# Patient Record
Sex: Male | Born: 1945 | Race: White | Hispanic: No | Marital: Single | Smoking: Former smoker
Health system: Southern US, Community
[De-identification: ages and names within clinical notes are randomized; demographics above are authoritative.]

## PROBLEM LIST (undated history)

## (undated) DIAGNOSIS — E119 Type 2 diabetes mellitus without complications: Secondary | ICD-10-CM

## (undated) DIAGNOSIS — K429 Umbilical hernia without obstruction or gangrene: Secondary | ICD-10-CM

## (undated) DIAGNOSIS — E1110 Type 2 diabetes mellitus with ketoacidosis without coma: Secondary | ICD-10-CM

## (undated) DIAGNOSIS — I4891 Unspecified atrial fibrillation: Secondary | ICD-10-CM

## (undated) DIAGNOSIS — N183 Chronic kidney disease, stage 3 unspecified: Secondary | ICD-10-CM

## (undated) DIAGNOSIS — E7800 Pure hypercholesterolemia, unspecified: Secondary | ICD-10-CM

## (undated) DIAGNOSIS — I10 Essential (primary) hypertension: Secondary | ICD-10-CM

## (undated) HISTORY — PX: PACEMAKER IMPLANT: EP1218

---

## 2017-06-07 DIAGNOSIS — Z86010 Personal history of colonic polyps: Secondary | ICD-10-CM

## 2017-06-07 HISTORY — PX: COLONOSCOPY: SHX174

## 2017-06-07 HISTORY — DX: Personal history of colonic polyps: Z86.010

## 2017-06-07 HISTORY — PX: ESOPHAGOGASTRODUODENOSCOPY: SHX1529

## 2017-12-24 ENCOUNTER — Other Ambulatory Visit: Payer: Self-pay | Admitting: Chiropractic Medicine

## 2017-12-24 DIAGNOSIS — M5416 Radiculopathy, lumbar region: Secondary | ICD-10-CM

## 2017-12-28 ENCOUNTER — Ambulatory Visit
Admission: RE | Admit: 2017-12-28 | Discharge: 2017-12-28 | Disposition: A | Discharge status: Home / Self Care | Payer: Medicare HMO | Source: Ambulatory Visit | Attending: Chiropractic Medicine | Admitting: Chiropractic Medicine

## 2017-12-28 DIAGNOSIS — M5416 Radiculopathy, lumbar region: Secondary | ICD-10-CM

## 2020-05-08 ENCOUNTER — Emergency Department (HOSPITAL_BASED_OUTPATIENT_CLINIC_OR_DEPARTMENT_OTHER): Payer: Medicare HMO

## 2020-05-08 ENCOUNTER — Inpatient Hospital Stay (HOSPITAL_BASED_OUTPATIENT_CLINIC_OR_DEPARTMENT_OTHER)
Admission: EM | Admit: 2020-05-08 | Discharge: 2020-05-14 | DRG: 417 | Disposition: A | Discharge status: Home / Self Care | Payer: Medicare HMO | Attending: Internal Medicine | Admitting: Internal Medicine

## 2020-05-08 ENCOUNTER — Encounter (HOSPITAL_BASED_OUTPATIENT_CLINIC_OR_DEPARTMENT_OTHER): Payer: Self-pay

## 2020-05-08 DIAGNOSIS — M47816 Spondylosis without myelopathy or radiculopathy, lumbar region: Secondary | ICD-10-CM | POA: Diagnosis present

## 2020-05-08 DIAGNOSIS — K429 Umbilical hernia without obstruction or gangrene: Secondary | ICD-10-CM | POA: Diagnosis present

## 2020-05-08 DIAGNOSIS — K5900 Constipation, unspecified: Secondary | ICD-10-CM | POA: Diagnosis present

## 2020-05-08 DIAGNOSIS — E11319 Type 2 diabetes mellitus with unspecified diabetic retinopathy without macular edema: Secondary | ICD-10-CM | POA: Diagnosis present

## 2020-05-08 DIAGNOSIS — N183 Chronic kidney disease, stage 3 unspecified: Secondary | ICD-10-CM | POA: Diagnosis present

## 2020-05-08 DIAGNOSIS — E1122 Type 2 diabetes mellitus with diabetic chronic kidney disease: Secondary | ICD-10-CM | POA: Diagnosis present

## 2020-05-08 DIAGNOSIS — L8990 Pressure ulcer of unspecified site, unspecified stage: Secondary | ICD-10-CM | POA: Insufficient documentation

## 2020-05-08 DIAGNOSIS — K8070 Calculus of gallbladder and bile duct without cholecystitis without obstruction: Principal | ICD-10-CM | POA: Diagnosis present

## 2020-05-08 DIAGNOSIS — L89151 Pressure ulcer of sacral region, stage 1: Secondary | ICD-10-CM | POA: Diagnosis present

## 2020-05-08 DIAGNOSIS — E7800 Pure hypercholesterolemia, unspecified: Secondary | ICD-10-CM | POA: Diagnosis present

## 2020-05-08 DIAGNOSIS — I4891 Unspecified atrial fibrillation: Secondary | ICD-10-CM | POA: Diagnosis present

## 2020-05-08 DIAGNOSIS — E876 Hypokalemia: Secondary | ICD-10-CM | POA: Diagnosis present

## 2020-05-08 DIAGNOSIS — I4821 Permanent atrial fibrillation: Secondary | ICD-10-CM | POA: Diagnosis present

## 2020-05-08 DIAGNOSIS — Z7984 Long term (current) use of oral hypoglycemic drugs: Secondary | ICD-10-CM

## 2020-05-08 DIAGNOSIS — I129 Hypertensive chronic kidney disease with stage 1 through stage 4 chronic kidney disease, or unspecified chronic kidney disease: Secondary | ICD-10-CM | POA: Diagnosis present

## 2020-05-08 DIAGNOSIS — Z7901 Long term (current) use of anticoagulants: Secondary | ICD-10-CM

## 2020-05-08 DIAGNOSIS — K8050 Calculus of bile duct without cholangitis or cholecystitis without obstruction: Secondary | ICD-10-CM | POA: Diagnosis present

## 2020-05-08 DIAGNOSIS — Z950 Presence of cardiac pacemaker: Secondary | ICD-10-CM

## 2020-05-08 DIAGNOSIS — N1832 Chronic kidney disease, stage 3b: Secondary | ICD-10-CM | POA: Diagnosis present

## 2020-05-08 DIAGNOSIS — E1110 Type 2 diabetes mellitus with ketoacidosis without coma: Secondary | ICD-10-CM | POA: Diagnosis present

## 2020-05-08 DIAGNOSIS — R7401 Elevation of levels of liver transaminase levels: Secondary | ICD-10-CM

## 2020-05-08 DIAGNOSIS — E785 Hyperlipidemia, unspecified: Secondary | ICD-10-CM | POA: Diagnosis present

## 2020-05-08 DIAGNOSIS — Z87442 Personal history of urinary calculi: Secondary | ICD-10-CM

## 2020-05-08 DIAGNOSIS — K420 Umbilical hernia with obstruction, without gangrene: Secondary | ICD-10-CM | POA: Diagnosis present

## 2020-05-08 DIAGNOSIS — Z87891 Personal history of nicotine dependence: Secondary | ICD-10-CM

## 2020-05-08 DIAGNOSIS — M419 Scoliosis, unspecified: Secondary | ICD-10-CM | POA: Diagnosis present

## 2020-05-08 DIAGNOSIS — Z79899 Other long term (current) drug therapy: Secondary | ICD-10-CM

## 2020-05-08 DIAGNOSIS — Z20822 Contact with and (suspected) exposure to covid-19: Secondary | ICD-10-CM | POA: Diagnosis present

## 2020-05-08 DIAGNOSIS — I10 Essential (primary) hypertension: Secondary | ICD-10-CM | POA: Diagnosis present

## 2020-05-08 HISTORY — DX: Type 2 diabetes mellitus with ketoacidosis without coma: E11.10

## 2020-05-08 HISTORY — DX: Essential (primary) hypertension: I10

## 2020-05-08 HISTORY — DX: Umbilical hernia without obstruction or gangrene: K42.9

## 2020-05-08 HISTORY — DX: Pure hypercholesterolemia, unspecified: E78.00

## 2020-05-08 HISTORY — DX: Unspecified atrial fibrillation: I48.91

## 2020-05-08 HISTORY — DX: Chronic kidney disease, stage 3 unspecified: N18.30

## 2020-05-08 HISTORY — DX: Type 2 diabetes mellitus without complications: E11.9

## 2020-05-08 LAB — COMPREHENSIVE METABOLIC PANEL
ALT: 172 U/L — ABNORMAL HIGH (ref 0–44)
AST: 303 U/L — ABNORMAL HIGH (ref 15–41)
Albumin: 4.2 g/dL (ref 3.5–5.0)
Alkaline Phosphatase: 360 U/L — ABNORMAL HIGH (ref 38–126)
Anion gap: 24 — ABNORMAL HIGH (ref 5–15)
BUN: 38 mg/dL — ABNORMAL HIGH (ref 8–23)
CO2: 22 mmol/L (ref 22–32)
Calcium: 10.6 mg/dL — ABNORMAL HIGH (ref 8.9–10.3)
Chloride: 89 mmol/L — ABNORMAL LOW (ref 98–111)
Creatinine, Ser: 1.57 mg/dL — ABNORMAL HIGH (ref 0.61–1.24)
GFR, Estimated: 46 mL/min — ABNORMAL LOW (ref >60)
Glucose, Bld: 396 mg/dL — ABNORMAL HIGH (ref 70–99)
Potassium: 3.3 mmol/L — ABNORMAL LOW (ref 3.5–5.1)
Sodium: 135 mmol/L (ref 135–145)
Total Bilirubin: 5.9 mg/dL — ABNORMAL HIGH (ref 0.3–1.2)
Total Protein: 7.6 g/dL (ref 6.5–8.1)

## 2020-05-08 LAB — CBC
HCT: 40.9 % (ref 39.0–52.0)
Hemoglobin: 14.2 g/dL (ref 13.0–17.0)
MCH: 30.1 pg (ref 26.0–34.0)
MCHC: 34.7 g/dL (ref 30.0–36.0)
MCV: 86.7 fL (ref 80.0–100.0)
Platelets: 147 10*3/uL — ABNORMAL LOW (ref 150–400)
RBC: 4.72 MIL/uL (ref 4.22–5.81)
RDW: 14.5 % (ref 11.5–15.5)
WBC: 8.3 10*3/uL (ref 4.0–10.5)
nRBC: 0 % (ref 0.0–0.2)

## 2020-05-08 LAB — CBG MONITORING, ED: Glucose-Capillary: 305 mg/dL — ABNORMAL HIGH (ref 70–99)

## 2020-05-08 LAB — LIPASE, BLOOD: Lipase: 39 U/L (ref 11–51)

## 2020-05-08 LAB — TROPONIN I (HIGH SENSITIVITY): Troponin I (High Sensitivity): 11 ng/L (ref <18)

## 2020-05-08 MED ORDER — LACTATED RINGERS IV BOLUS
20.0000 mL/kg | Freq: Once | INTRAVENOUS | Status: AC
  Administered 2020-05-08: 1842 mL via INTRAVENOUS

## 2020-05-08 MED ORDER — INSULIN REGULAR(HUMAN) IN NACL 100-0.9 UT/100ML-% IV SOLN
INTRAVENOUS | Status: DC
  Administered 2020-05-08: 6 [IU]/h via INTRAVENOUS
  Filled 2020-05-08: qty 100

## 2020-05-08 MED ORDER — DEXTROSE 50 % IV SOLN: 0.0000 mL | INTRAVENOUS | Status: DC | PRN

## 2020-05-08 MED ORDER — POTASSIUM CHLORIDE 10 MEQ/100ML IV SOLN
10.0000 meq | INTRAVENOUS | Status: AC
  Administered 2020-05-08 – 2020-05-09 (×3): 10 meq via INTRAVENOUS
  Filled 2020-05-08 (×4): qty 100

## 2020-05-08 MED ORDER — SODIUM CHLORIDE 0.9 % IV BOLUS: 1000.0000 mL | Freq: Once | INTRAVENOUS | Status: DC

## 2020-05-08 MED ORDER — LACTATED RINGERS IV SOLN: INTRAVENOUS | Status: DC

## 2020-05-08 MED ORDER — SODIUM CHLORIDE 0.9 % IV SOLN: INTRAVENOUS | Status: DC | PRN

## 2020-05-08 MED ORDER — DEXTROSE IN LACTATED RINGERS 5 % IV SOLN: INTRAVENOUS | Status: DC

## 2020-05-08 NOTE — ED Triage Notes (Signed)
Pt states he hasnt had a BM in 2 days. C/o chest pain across chest since 11am. Vomiting the past few hours.

## 2020-05-08 NOTE — Progress Notes (Signed)
Pt venous blood gas not crossing over from i-STAT machine into epic.  Results given to MD and note made in chart.  Results are as follows: pH-7.40, PCO2-43.3, PvO2-29, HCO3-27.4

## 2020-05-08 NOTE — ED Provider Notes (Signed)
[facility] EMERGENCY DEPARTMENT Provider Note   CSN: [id number] Arrival date & time: 05/08/20  1733     History Chief Complaint  Patient presents with  . Chest Pain  . Vomiting    Gabriel Duarte is a 75 y.o. male with past medical history of A. fib on Xarelto, CKD, hypertension, hyperlipidemia, diabetes, presenting emergency department with complaint of chest pain that began at 11 AM this morning.  Patient states he was watching TV.  Pain was felt across his chest, and was persisting until arriving to the ED.  He states his began to ease off.  He denies associated diaphoresis, shortness of breath, abdominal pain.  He did have some upset stomach and vomited this afternoon and thinks that is improving his symptoms.  Also states that he has been having constipation for last few days.  His stools are small and hard.  He states he drank a few tablespoons of Epson salt today with a diet drink thinking that would help his bowels move.  He did take Metamucil yesterday as well for symptoms without relief. Denies abdominal pain, history of small bowel obstruction or abdominal surgeries.  Per chart review, gets most of his medical care by [facility]. Takes Xarelto, and has not missed any doses  The history is provided by the patient and medical records.       Past Medical History:  Diagnosis Date  . Atrial fibrillation (HCC)   . CKD (chronic kidney disease) stage 3, GFR 30-59 ml/min (HCC)   . Diabetes mellitus without complication (HCC)   . High cholesterol   . Hypertension     Patient Active Problem List   Diagnosis Date Noted  . Choledocholithiasis 05/09/2020    Past Surgical History:  Procedure Laterality Date  . PACEMAKER IMPLANT         History reviewed. No pertinent family history.  Social History   Tobacco Use  . Smoking status: Former Smoker  Substance Use Topics  . Alcohol use: Never  . Drug use: Never    Home Medications Prior to Admission  medications   Medication Sig Start Date End Date Taking? Authorizing Provider  amLODipine (NORVASC) 10 MG tablet  04/09/20  Yes [provider]  Cholecalciferol 125 MCG (5000 UT) TABS Take by mouth. 02/19/17  Yes [provider]  fluticasone (FLONASE) 50 MCG/ACT nasal spray 2 sprays by Each Nare route daily as needed. 07/21/15  Yes [provider]  glipiZIDE (GLUCOTROL) 10 MG tablet Take by mouth. 06/12/10  Yes [provider]  linagliptin (TRADJENTA) 5 MG TABS tablet Take 1 tablet by mouth daily. 09/08/16  Yes [provider]  propranolol (INDERAL) 20 MG tablet Take 20 mg by mouth daily.   Yes [provider]  empagliflozin (JARDIANCE) 10 MG TABS tablet Take by mouth.    [provider]  hydrochlorothiazide (HYDRODIURIL) 25 MG tablet Take by mouth.    [provider]  Omega-3 Fatty Acids (FISH OIL) 1000 MG CAPS Take by mouth.    [provider]  STUDY - ASPIRE - apixaban 5 mg or placebo tablet (PI-Sethi) Eliquis 5 mg tablet    [provider]    Allergies    Patient has no known allergies.  Review of Systems   Review of Systems  Constitutional: Negative for chills, diaphoresis and fever.  Respiratory: Negative for cough and shortness of breath.   Cardiovascular: Positive for chest pain. Negative for leg swelling.  Gastrointestinal: Positive for constipation and  vomiting.  All other systems reviewed and are negative.   Physical Exam Updated Vital Signs BP (!) 146/86 (BP Location: Right Arm)   Pulse 93   Temp 97.6 F (36.4 C) (Oral)   Resp 20   Ht 5\' 7"  (1.702 m)   Wt 92.1 kg   SpO2 95%   BMI 31.79 kg/m   Physical Exam Vitals and nursing note reviewed.  Constitutional:      General: He is not in acute distress.    Appearance: He is well-developed and well-nourished.  HENT:     Head: Normocephalic and atraumatic.  Eyes:     General: Scleral icterus present.  Cardiovascular:     Rate and  Rhythm: Normal rate. Rhythm irregular.     Comments: Trace pretibial edema bilaterally Pulmonary:     Effort: Pulmonary effort is normal. No respiratory distress.     Comments: Diminished bilaterally Abdominal:     General: Bowel sounds are normal.     Palpations: Abdomen is soft.     Tenderness: There is no abdominal tenderness. There is no guarding or rebound.     Hernia: A hernia (Large umbilical hernia (chronic) nontender.) is present.  Skin:    General: Skin is warm.  Neurological:     Mental Status: He is alert.  Psychiatric:        Mood and Affect: Mood and affect normal.        Behavior: Behavior normal.     ED Results / Procedures / Treatments   Labs (all labs ordered are listed, but only abnormal results are displayed) Labs Reviewed  CBC - Abnormal; Notable for the following components:      Result Value   Platelets 147 (*)    All other components within normal limits  COMPREHENSIVE METABOLIC PANEL - Abnormal; Notable for the following components:   Potassium 3.3 (*)    Chloride 89 (*)    Glucose, Bld 396 (*)    BUN 38 (*)    Creatinine, Ser 1.57 (*)    Calcium 10.6 (*)    AST 303 (*)    ALT 172 (*)    Alkaline Phosphatase 360 (*)    Total Bilirubin 5.9 (*)    GFR, Estimated 46 (*)    Anion gap 24 (*)    All other components within normal limits  URINALYSIS, ROUTINE W REFLEX MICROSCOPIC - Abnormal; Notable for the following components:   Glucose, UA >=500 (*)    Hgb urine dipstick SMALL (*)    Ketones, ur 80 (*)    All other components within normal limits  URINALYSIS, MICROSCOPIC (REFLEX) - Abnormal; Notable for the following components:   Bacteria, UA RARE (*)    All other components within normal limits  CBG MONITORING, ED - Abnormal; Notable for the following components:   Glucose-Capillary 305 (*)    All other components within normal limits  CBG MONITORING, ED - Abnormal; Notable for the following components:   Glucose-Capillary 264 (*)    All  other components within normal limits  RESP PANEL BY RT-PCR (FLU A&B, COVID) ARPGX2  LIPASE, BLOOD  PROTIME-INR  BLOOD GAS, VENOUS  BETA-HYDROXYBUTYRIC ACID  HEPATITIS PANEL, ACUTE  TROPONIN I (HIGH SENSITIVITY)  TROPONIN I (HIGH SENSITIVITY)   VBG: pH-7.40, PCO2-43.3, PvO2-29, HCO3-27.4   EKG None  Radiology CT Abdomen Pelvis Wo Contrast  Result Date: 05/08/2020 CLINICAL DATA:  Acute nonlocalized abdominal pain, constipation, transaminitis EXAM: CT ABDOMEN AND PELVIS WITHOUT CONTRAST TECHNIQUE: Multidetector CT imaging of  the abdomen and pelvis was performed following the standard protocol without IV contrast. COMPARISON:  None. FINDINGS: Lower chest: The visualized lung bases are clear. Mediastinal shift to the left. Extensive multi-vessel coronary artery calcification. Mild global cardiomegaly. Metallic foreign body is seen within the right ventricular outflow tract. Mild pericardial calcification Hepatobiliary: Layering hyperdensity within the gallbladder likely represents sludge or layering gallstones. No pericholecystic inflammatory changes identified, however. The liver is unremarkable. No intra or extrahepatic biliary ductal dilation. 8 mm calcified calculus or debris is seen within the distal common duct in keeping with choledocholithiasis. Pancreas: Unremarkable Spleen: Unremarkable Adrenals/Urinary Tract: The adrenal glands are unremarkable. The kidneys are normal in size and position. 2.4 cm exophytic simple cortical cyst arises from the lower pole of the left kidney. Immediately adjacent to this a homogeneously hyperdense lesion measuring 2.5 cm is seen most in keeping with a hyperdense renal cyst containing probable milk of calcium. The kidneys are otherwise unremarkable. Bladder unremarkable. Stomach/Bowel: Mild sigmoid diverticulosis. The stomach, small bowel, and large bowel are otherwise unremarkable. Appendix absent. No free intraperitoneal gas or fluid. Vascular/Lymphatic:  Extensive aortoiliac atherosclerotic calcification. No aortic aneurysm. No pathologic adenopathy within the abdomen and pelvis. Reproductive: Prostate is unremarkable. Other: Moderate fat containing umbilical hernia. Mild soft tissue infiltration involving the subcutaneous fat surrounding the hernia sac may relate to early changes of incarceration. Musculoskeletal: Degenerative changes are seen within the lumbar spine. No lytic or blastic bone lesion. IMPRESSION: Choledocholithiasis with 8 mm calculus or calcific debris within the distal duct at the level of the ampulla. Cholelithiasis. Moderate fat containing umbilical hernia with surrounding inflammatory stranding within the subcutaneous fat of the anterior abdominal wall possibly representing early changes of incarceration. Aortic Atherosclerosis (ICD10-I70.0). Electronically Signed   By: Helyn Numbers MD   On: 05/08/2020 23:35   DG Chest 2 View  Result Date: 05/08/2020 CLINICAL DATA:  Chest pain EXAM: CHEST - 2 VIEW COMPARISON:  Chest x-ray dated 06/30/2017. FINDINGS: Stable cardiomegaly. Lungs are clear. No pleural effusion or pneumothorax is seen. Osseous structures about the chest are unremarkable. IMPRESSION: No active cardiopulmonary disease. No evidence of pneumonia or pulmonary edema. Stable cardiomegaly. Electronically Signed   By: Bary Richard M.D.   On: 05/08/2020 18:26   US Abdomen Limited RUQ (LIVER/GB)  Result Date: 05/08/2020 CLINICAL DATA:  Transaminitis EXAM: ULTRASOUND ABDOMEN LIMITED RIGHT UPPER QUADRANT COMPARISON:  None. FINDINGS: Gallbladder: Gallbladder is moderately distended, with dependent gallbladder sludge identified. No shadowing gallstones. Borderline gallbladder wall thickening measuring 3 mm. No pericholecystic fluid. Negative sonographic Murphy sign. Common bile duct: Diameter: 7 mm Liver: Diffuse increased liver echotexture most consistent with hepatic steatosis. No focal liver abnormality. Portal vein is patent on color  Doppler imaging with normal direction of blood flow towards the liver. Other: Incidental 1.8 cm simple right renal cyst. IMPRESSION: 1. Gallbladder sludge, with nonspecific borderline gallbladder wall thickening. No evidence of cholelithiasis or cholecystitis. 2. Hepatic steatosis. Electronically Signed   By: Sharlet Salina M.D.   On: 05/08/2020 21:49    Procedures Procedures   Medications Ordered in ED Medications  insulin regular, human (MYXREDLIN) 100 units/ 100 mL infusion (4.2 Units/hr Intravenous Rate/Dose Change 05/09/20 0032)  lactated ringers infusion (has no administration in time range)  dextrose 5 % in lactated ringers infusion (has no administration in time range)  dextrose 50 % solution 0-50 mL (has no administration in time range)  potassium chloride 10 mEq in 100 mL IVPB (10 mEq Intravenous New Bag/Given 05/08/20 2342)  0.9 %  sodium chloride infusion ( Intravenous New Bag/Given 05/08/20 2340)  piperacillin-tazobactam (ZOSYN) IVPB 3.375 g (has no administration in time range)  piperacillin-tazobactam (ZOSYN) IVPB 3.375 g (has no administration in time range)  lactated ringers bolus 1,842 mL (1,842 mLs Intravenous New Bag/Given 05/08/20 2257)    ED Course  I have reviewed the triage vital signs and the nursing notes.  Pertinent labs & imaging results that were available during my care of the patient were reviewed by me and considered in my medical decision making (see chart for details).  Clinical Course as of 05/09/20 0035  Wed May 08, 2020  2231 Glucose(!): 396 [JR]  2231 AST(!): 303 [JR]  2231 ALT(!): 172 [JR]  2231 Alkaline Phosphatase(!): 360 [JR]  2232 Total Bilirubin(!): 5.9 [JR]  2232 Anion gap(!): 24 [JR]  Thu May 09, 2020  0000 Discussed with general surgery Dr. Corliss Skains.  Recommend CT findings do not require any emergent surgical intervention at this time.  Agrees with medical admission and gastroenterology management. [JR]    Clinical Course User Index [JR]  Mirjana Tarleton, [place] N, PA-C   MDM Rules/Calculators/A&P                          Patient is 75 year old gentleman presenting with complaint of chest pain across his chest that began 11 AM while watching TV.  He has some discomfort throughout the day though improved after vomiting x1.  He also has had constipation for last few days.  On exam he is not having any symptoms of pain.  His abdomen is benign.  He is noted to have an umbilical hernia which he states is chronic and not causing him any issues.  It is not tender on exam and appears to reduce.  He is in no acute distress.  His initial vital signs are within normal limits, afebrile.  Will order cardiac markers, as well as CBC, CMP, lipase, EKG and chest x-ray.  Troponins are unremarkable.  Chest x-ray is clear.  Patient has large elevated gap at 24 with hyperglycemia at 400.  VBG though shows pH 7.4 within normal limits.  No other obvious source of significant elevation in gap.  He has only vomited once today.  [ VBG results: pH-7.40, PCO2-43.3, PvO2-29, HCO3-27.4]  Regarding transaminitis, he is not having any abdominal pain or tenderness on exam.  He has felt better after having the one episode of emesis earlier today.  He is however constipated.  His ultrasound shows gallbladder sludge with possible gallbladder wall thickening though no pericholecystic fluid or stones.  He is afebrile.  CT scan ordered for additional information and is findings consistent with choledocholithiasis with 8 mm calculus in the distal duct of the ampulla.  Also with fat-containing umbilical hernia with some surrounding stranding concerning for incarceration.  However he is not having much pain in this area.  Findings discussed with general surgeon, agrees nonoperative management by general surgery.  GI, Dr. Lavon Paganini, is sent to secure chat message for consultation in the morning.  IV zosyn ordered.   Had long conversation with patient, as initially he wanted to go  home.  Discussed significance of abnormal findings today on work-up and necessity for admission with risks for discharge to home.  Patient reluctant though agreeable to admission for further management.    Final Clinical Impression(s) / ED Diagnoses Final diagnoses:  Transaminitis  Choledocholithiasis  Umbilical hernia without obstruction and without gangrene  Constipation, unspecified constipation type  Rx / DC Orders ED Discharge Orders    None       Liba Hulsey, [place] N, PA-C 05/09/20 Juanetta Snow, MD 05/16/20 1021

## 2020-05-09 ENCOUNTER — Encounter (HOSPITAL_COMMUNITY): Payer: Self-pay | Admitting: Internal Medicine

## 2020-05-09 DIAGNOSIS — L89151 Pressure ulcer of sacral region, stage 1: Secondary | ICD-10-CM | POA: Diagnosis present

## 2020-05-09 DIAGNOSIS — Z20822 Contact with and (suspected) exposure to covid-19: Secondary | ICD-10-CM | POA: Diagnosis present

## 2020-05-09 DIAGNOSIS — Z01810 Encounter for preprocedural cardiovascular examination: Secondary | ICD-10-CM | POA: Diagnosis not present

## 2020-05-09 DIAGNOSIS — N1832 Chronic kidney disease, stage 3b: Secondary | ICD-10-CM | POA: Diagnosis present

## 2020-05-09 DIAGNOSIS — L8990 Pressure ulcer of unspecified site, unspecified stage: Secondary | ICD-10-CM | POA: Insufficient documentation

## 2020-05-09 DIAGNOSIS — N183 Chronic kidney disease, stage 3 unspecified: Secondary | ICD-10-CM | POA: Diagnosis present

## 2020-05-09 DIAGNOSIS — I4811 Longstanding persistent atrial fibrillation: Secondary | ICD-10-CM | POA: Diagnosis not present

## 2020-05-09 DIAGNOSIS — Z950 Presence of cardiac pacemaker: Secondary | ICD-10-CM | POA: Diagnosis not present

## 2020-05-09 DIAGNOSIS — I10 Essential (primary) hypertension: Secondary | ICD-10-CM | POA: Diagnosis not present

## 2020-05-09 DIAGNOSIS — Z7984 Long term (current) use of oral hypoglycemic drugs: Secondary | ICD-10-CM | POA: Diagnosis not present

## 2020-05-09 DIAGNOSIS — Z79899 Other long term (current) drug therapy: Secondary | ICD-10-CM | POA: Diagnosis not present

## 2020-05-09 DIAGNOSIS — K8050 Calculus of bile duct without cholangitis or cholecystitis without obstruction: Secondary | ICD-10-CM | POA: Diagnosis present

## 2020-05-09 DIAGNOSIS — Z7901 Long term (current) use of anticoagulants: Secondary | ICD-10-CM | POA: Diagnosis not present

## 2020-05-09 DIAGNOSIS — K8070 Calculus of gallbladder and bile duct without cholecystitis without obstruction: Secondary | ICD-10-CM | POA: Diagnosis present

## 2020-05-09 DIAGNOSIS — E876 Hypokalemia: Secondary | ICD-10-CM | POA: Diagnosis present

## 2020-05-09 DIAGNOSIS — M419 Scoliosis, unspecified: Secondary | ICD-10-CM | POA: Diagnosis present

## 2020-05-09 DIAGNOSIS — R945 Abnormal results of liver function studies: Secondary | ICD-10-CM | POA: Diagnosis not present

## 2020-05-09 DIAGNOSIS — K5900 Constipation, unspecified: Secondary | ICD-10-CM | POA: Diagnosis present

## 2020-05-09 DIAGNOSIS — E785 Hyperlipidemia, unspecified: Secondary | ICD-10-CM | POA: Diagnosis present

## 2020-05-09 DIAGNOSIS — E11319 Type 2 diabetes mellitus with unspecified diabetic retinopathy without macular edema: Secondary | ICD-10-CM | POA: Diagnosis present

## 2020-05-09 DIAGNOSIS — E1110 Type 2 diabetes mellitus with ketoacidosis without coma: Secondary | ICD-10-CM | POA: Diagnosis present

## 2020-05-09 DIAGNOSIS — E1122 Type 2 diabetes mellitus with diabetic chronic kidney disease: Secondary | ICD-10-CM | POA: Diagnosis present

## 2020-05-09 DIAGNOSIS — I129 Hypertensive chronic kidney disease with stage 1 through stage 4 chronic kidney disease, or unspecified chronic kidney disease: Secondary | ICD-10-CM | POA: Diagnosis present

## 2020-05-09 DIAGNOSIS — K420 Umbilical hernia with obstruction, without gangrene: Secondary | ICD-10-CM | POA: Diagnosis present

## 2020-05-09 DIAGNOSIS — M47816 Spondylosis without myelopathy or radiculopathy, lumbar region: Secondary | ICD-10-CM | POA: Diagnosis present

## 2020-05-09 DIAGNOSIS — Z87898 Personal history of other specified conditions: Secondary | ICD-10-CM | POA: Diagnosis not present

## 2020-05-09 DIAGNOSIS — I4821 Permanent atrial fibrillation: Secondary | ICD-10-CM | POA: Diagnosis present

## 2020-05-09 DIAGNOSIS — R7401 Elevation of levels of liver transaminase levels: Secondary | ICD-10-CM

## 2020-05-09 DIAGNOSIS — Z87442 Personal history of urinary calculi: Secondary | ICD-10-CM | POA: Diagnosis not present

## 2020-05-09 DIAGNOSIS — K429 Umbilical hernia without obstruction or gangrene: Secondary | ICD-10-CM | POA: Diagnosis present

## 2020-05-09 DIAGNOSIS — Z87891 Personal history of nicotine dependence: Secondary | ICD-10-CM | POA: Diagnosis not present

## 2020-05-09 DIAGNOSIS — E7800 Pure hypercholesterolemia, unspecified: Secondary | ICD-10-CM | POA: Diagnosis present

## 2020-05-09 DIAGNOSIS — I4891 Unspecified atrial fibrillation: Secondary | ICD-10-CM | POA: Diagnosis present

## 2020-05-09 LAB — GLUCOSE, CAPILLARY
Glucose-Capillary: 201 mg/dL — ABNORMAL HIGH (ref 70–99)
Glucose-Capillary: 186 mg/dL — ABNORMAL HIGH (ref 70–99)
Glucose-Capillary: 199 mg/dL — ABNORMAL HIGH (ref 70–99)
Glucose-Capillary: 197 mg/dL — ABNORMAL HIGH (ref 70–99)
Glucose-Capillary: 190 mg/dL — ABNORMAL HIGH (ref 70–99)
Glucose-Capillary: 176 mg/dL — ABNORMAL HIGH (ref 70–99)
Glucose-Capillary: 188 mg/dL — ABNORMAL HIGH (ref 70–99)
Glucose-Capillary: 180 mg/dL — ABNORMAL HIGH (ref 70–99)
Glucose-Capillary: 163 mg/dL — ABNORMAL HIGH (ref 70–99)
Glucose-Capillary: 157 mg/dL — ABNORMAL HIGH (ref 70–99)
Glucose-Capillary: 265 mg/dL — ABNORMAL HIGH (ref 70–99)
Glucose-Capillary: 333 mg/dL — ABNORMAL HIGH (ref 70–99)
Glucose-Capillary: 277 mg/dL — ABNORMAL HIGH (ref 70–99)

## 2020-05-09 LAB — URINALYSIS, MICROSCOPIC (REFLEX)

## 2020-05-09 LAB — BLOOD GAS, VENOUS
Acid-Base Excess: 2.3 mmol/L — ABNORMAL HIGH (ref 0.0–2.0)
Bicarbonate: 25.1 mmol/L (ref 20.0–28.0)
O2 Saturation: 91.6 %
Patient temperature: 98.6
pCO2, Ven: 34 mmHg — ABNORMAL LOW (ref 44.0–60.0)
pH, Ven: 7.48 — ABNORMAL HIGH (ref 7.250–7.430)
pO2, Ven: 57.2 mmHg — ABNORMAL HIGH (ref 32.0–45.0)

## 2020-05-09 LAB — BASIC METABOLIC PANEL
Anion gap: 16 — ABNORMAL HIGH (ref 5–15)
Anion gap: 11 (ref 5–15)
Anion gap: 13 (ref 5–15)
BUN: 34 mg/dL — ABNORMAL HIGH (ref 8–23)
BUN: 34 mg/dL — ABNORMAL HIGH (ref 8–23)
BUN: 30 mg/dL — ABNORMAL HIGH (ref 8–23)
CO2: 25 mmol/L (ref 22–32)
CO2: 27 mmol/L (ref 22–32)
CO2: 27 mmol/L (ref 22–32)
Calcium: 9.5 mg/dL (ref 8.9–10.3)
Calcium: 9.3 mg/dL (ref 8.9–10.3)
Calcium: 9.4 mg/dL (ref 8.9–10.3)
Chloride: 97 mmol/L — ABNORMAL LOW (ref 98–111)
Chloride: 98 mmol/L (ref 98–111)
Chloride: 98 mmol/L (ref 98–111)
Creatinine, Ser: 1.4 mg/dL — ABNORMAL HIGH (ref 0.61–1.24)
Creatinine, Ser: 1.32 mg/dL — ABNORMAL HIGH (ref 0.61–1.24)
Creatinine, Ser: 1.31 mg/dL — ABNORMAL HIGH (ref 0.61–1.24)
GFR, Estimated: 53 mL/min — ABNORMAL LOW (ref >60)
GFR, Estimated: 57 mL/min — ABNORMAL LOW (ref >60)
GFR, Estimated: 57 mL/min — ABNORMAL LOW (ref >60)
Glucose, Bld: 210 mg/dL — ABNORMAL HIGH (ref 70–99)
Glucose, Bld: 205 mg/dL — ABNORMAL HIGH (ref 70–99)
Glucose, Bld: 171 mg/dL — ABNORMAL HIGH (ref 70–99)
Potassium: 3.3 mmol/L — ABNORMAL LOW (ref 3.5–5.1)
Potassium: 3.7 mmol/L (ref 3.5–5.1)
Potassium: 3.2 mmol/L — ABNORMAL LOW (ref 3.5–5.1)
Sodium: 138 mmol/L (ref 135–145)
Sodium: 136 mmol/L (ref 135–145)
Sodium: 138 mmol/L (ref 135–145)

## 2020-05-09 LAB — PROTIME-INR
INR: 1.2 (ref 0.8–1.2)
Prothrombin Time: 14.5 seconds (ref 11.4–15.2)

## 2020-05-09 LAB — HEPATITIS PANEL, ACUTE
HCV Ab: NONREACTIVE
Hep A IgM: NONREACTIVE
Hep B C IgM: NONREACTIVE
Hepatitis B Surface Ag: NONREACTIVE

## 2020-05-09 LAB — I-STAT VENOUS BLOOD GAS, ED
Acid-Base Excess: 2 mmol/L (ref 0.0–2.0)
Bicarbonate: 27.4 mmol/L (ref 20.0–28.0)
Calcium, Ion: 1.23 mmol/L (ref 1.15–1.40)
HCT: 37 % — ABNORMAL LOW (ref 39.0–52.0)
Hemoglobin: 12.6 g/dL — ABNORMAL LOW (ref 13.0–17.0)
O2 Saturation: 55 %
Patient temperature: 98.6
Potassium: 3.2 mmol/L — ABNORMAL LOW (ref 3.5–5.1)
Sodium: 136 mmol/L (ref 135–145)
TCO2: 29 mmol/L (ref 22–32)
pCO2, Ven: 43.3 mmHg — ABNORMAL LOW (ref 44.0–60.0)
pH, Ven: 7.409 (ref 7.250–7.430)
pO2, Ven: 29 mmHg — CL (ref 32.0–45.0)

## 2020-05-09 LAB — MAGNESIUM
Magnesium: 2.1 mg/dL (ref 1.7–2.4)
Magnesium: 2.4 mg/dL (ref 1.7–2.4)

## 2020-05-09 LAB — HEPATIC FUNCTION PANEL
ALT: 255 U/L — ABNORMAL HIGH (ref 0–44)
AST: 357 U/L — ABNORMAL HIGH (ref 15–41)
Albumin: 3 g/dL — ABNORMAL LOW (ref 3.5–5.0)
Alkaline Phosphatase: 313 U/L — ABNORMAL HIGH (ref 38–126)
Bilirubin, Direct: 3.6 mg/dL — ABNORMAL HIGH (ref 0.0–0.2)
Indirect Bilirubin: 2.5 mg/dL — ABNORMAL HIGH (ref 0.3–0.9)
Total Bilirubin: 6.1 mg/dL — ABNORMAL HIGH (ref 0.3–1.2)
Total Protein: 5.8 g/dL — ABNORMAL LOW (ref 6.5–8.1)

## 2020-05-09 LAB — BETA-HYDROXYBUTYRIC ACID
Beta-Hydroxybutyric Acid: 5.83 mmol/L — ABNORMAL HIGH (ref 0.05–0.27)
Beta-Hydroxybutyric Acid: 3.48 mmol/L — ABNORMAL HIGH (ref 0.05–0.27)

## 2020-05-09 LAB — URINALYSIS, ROUTINE W REFLEX MICROSCOPIC
Bilirubin Urine: NEGATIVE
Glucose, UA: >=500 mg/dL — AB
Ketones, ur: 80 mg/dL — AB
Leukocytes,Ua: NEGATIVE
Nitrite: NEGATIVE
Protein, ur: NEGATIVE mg/dL
Specific Gravity, Urine: 1.015 (ref 1.005–1.030)
pH: 5 (ref 5.0–8.0)

## 2020-05-09 LAB — CBG MONITORING, ED
Glucose-Capillary: 264 mg/dL — ABNORMAL HIGH (ref 70–99)
Glucose-Capillary: 272 mg/dL — ABNORMAL HIGH (ref 70–99)

## 2020-05-09 LAB — MRSA PCR SCREENING: MRSA by PCR: NEGATIVE

## 2020-05-09 LAB — RESP PANEL BY RT-PCR (FLU A&B, COVID) ARPGX2
Influenza A by PCR: NEGATIVE
Influenza B by PCR: NEGATIVE
SARS Coronavirus 2 by RT PCR: NEGATIVE

## 2020-05-09 LAB — TROPONIN I (HIGH SENSITIVITY): Troponin I (High Sensitivity): 13 ng/L (ref <18)

## 2020-05-09 LAB — HEMOGLOBIN A1C
Hgb A1c MFr Bld: 11.1 % — ABNORMAL HIGH (ref 4.8–5.6)
Mean Plasma Glucose: 271.87 mg/dL

## 2020-05-09 MED ORDER — ORAL CARE MOUTH RINSE
15.0000 mL | Freq: Two times a day (BID) | OROMUCOSAL | Status: DC
  Administered 2020-05-09 – 2020-05-14 (×10): 15 mL via OROMUCOSAL

## 2020-05-09 MED ORDER — SODIUM CHLORIDE 0.9 % IV SOLN: INTRAVENOUS | Status: DC

## 2020-05-09 MED ORDER — PIPERACILLIN-TAZOBACTAM 3.375 G IVPB 30 MIN
3.3750 g | Freq: Once | INTRAVENOUS | Status: AC
  Administered 2020-05-09: 3.375 g via INTRAVENOUS
  Filled 2020-05-09: qty 50

## 2020-05-09 MED ORDER — POTASSIUM CHLORIDE 10 MEQ/100ML IV SOLN: 10.0000 meq | Freq: Once | INTRAVENOUS | Status: DC

## 2020-05-09 MED ORDER — INSULIN ASPART 100 UNIT/ML ~~LOC~~ SOLN
2.0000 [IU] | Freq: Three times a day (TID) | SUBCUTANEOUS | Status: DC
  Administered 2020-05-09 – 2020-05-14 (×11): 2 [IU] via SUBCUTANEOUS

## 2020-05-09 MED ORDER — INSULIN ASPART 100 UNIT/ML ~~LOC~~ SOLN
0.0000 [IU] | Freq: Three times a day (TID) | SUBCUTANEOUS | Status: DC
  Administered 2020-05-09 – 2020-05-10 (×2): 8 [IU] via SUBCUTANEOUS
  Administered 2020-05-10: 5 [IU] via SUBCUTANEOUS
  Administered 2020-05-10: 2 [IU] via SUBCUTANEOUS
  Administered 2020-05-11 (×3): 3 [IU] via SUBCUTANEOUS
  Administered 2020-05-11: 2 [IU] via SUBCUTANEOUS
  Administered 2020-05-12: 5 [IU] via SUBCUTANEOUS
  Administered 2020-05-12: 3 [IU] via SUBCUTANEOUS
  Administered 2020-05-13: 11 [IU] via SUBCUTANEOUS
  Administered 2020-05-13: 5 [IU] via SUBCUTANEOUS
  Administered 2020-05-13: 3 [IU] via SUBCUTANEOUS
  Administered 2020-05-13: 8 [IU] via SUBCUTANEOUS
  Administered 2020-05-14: 3 [IU] via SUBCUTANEOUS
  Administered 2020-05-14: 5 [IU] via SUBCUTANEOUS

## 2020-05-09 MED ORDER — ACETAMINOPHEN 325 MG PO TABS
650.0000 mg | ORAL_TABLET | Freq: Four times a day (QID) | ORAL | Status: DC | PRN
  Administered 2020-05-09 – 2020-05-12 (×2): 650 mg via ORAL
  Filled 2020-05-09 (×2): qty 2

## 2020-05-09 MED ORDER — PIPERACILLIN-TAZOBACTAM 3.375 G IVPB
3.3750 g | Freq: Three times a day (TID) | INTRAVENOUS | Status: DC
  Administered 2020-05-09 – 2020-05-11 (×7): 3.375 g via INTRAVENOUS
  Filled 2020-05-09 (×7): qty 50

## 2020-05-09 MED ORDER — CHLORHEXIDINE GLUCONATE CLOTH 2 % EX PADS
6.0000 | MEDICATED_PAD | Freq: Every day | CUTANEOUS | Status: DC
  Administered 2020-05-09 – 2020-05-13 (×3): 6 via TOPICAL

## 2020-05-09 MED ORDER — POTASSIUM CHLORIDE 10 MEQ/100ML IV SOLN: 10.0000 meq | INTRAVENOUS | Status: DC

## 2020-05-09 MED ORDER — POTASSIUM CHLORIDE 10 MEQ/100ML IV SOLN
10.0000 meq | INTRAVENOUS | Status: AC
  Administered 2020-05-09 (×4): 10 meq via INTRAVENOUS
  Filled 2020-05-09 (×2): qty 100

## 2020-05-09 MED ORDER — POTASSIUM CHLORIDE CRYS ER 20 MEQ PO TBCR
40.0000 meq | EXTENDED_RELEASE_TABLET | Freq: Once | ORAL | Status: AC
  Administered 2020-05-09: 40 meq via ORAL
  Filled 2020-05-09: qty 2

## 2020-05-09 MED ORDER — INSULIN GLARGINE 100 UNIT/ML ~~LOC~~ SOLN
7.0000 [IU] | Freq: Every day | SUBCUTANEOUS | Status: DC
  Administered 2020-05-09: 11:00:00 7 [IU] via SUBCUTANEOUS
  Filled 2020-05-09 (×2): qty 0.07

## 2020-05-09 MED ORDER — INSULIN ASPART 100 UNIT/ML ~~LOC~~ SOLN
0.0000 [IU] | Freq: Three times a day (TID) | SUBCUTANEOUS | Status: DC
  Administered 2020-05-09: 5 [IU] via SUBCUTANEOUS

## 2020-05-09 NOTE — H&P (Signed)
History and Physical    Gabriel Gabriel JIR:[id number] DOB: Oct 18, 1945 DOA: 05/08/2020  PCP: [facility]  Patient coming from: Home  I have personally briefly reviewed patient's old medical records in [facility]  Chief Complaint: N/V, CP  HPI: Gabriel Gabriel is a 75 y.o. male with medical history significant of A.Fib on eliquis, PPM implant, CKD 3, DM2, HTN.  Pt presents to ED with c/o CP and epigastric abd pain onset 11am.  Patient states he was watching TV.  Pain was felt across his chest, and was persisting until arriving to the ED.  He states his began to ease off.  He denies associated diaphoresis, shortness of breath.  Symptoms were severe, now improved.  Denies diarrhea (actually a bit constipated he relates).  No fevers, chills.   ED Course: Pt has DKA, pt also appears to have choledocholithiasis with LFT elevations.  Trop neg x2  Started on empiric zosyn.   Review of Systems: As per HPI, otherwise all review of systems negative.  Past Medical History:  Diagnosis Date  . Atrial fibrillation (HCC)   . CKD (chronic kidney disease) stage 3, GFR 30-59 ml/min (HCC)   . Diabetes mellitus without complication (HCC)   . High cholesterol   . Hypertension     Past Surgical History:  Procedure Laterality Date  . PACEMAKER IMPLANT     For Tachybrady     reports that he has quit smoking. He does not have any smokeless tobacco history on file. He reports that he does not drink alcohol and does not use drugs.  No Known Allergies  History reviewed. No pertinent family history.   Prior to Admission medications   Medication Sig Start Date End Date Taking? Authorizing Provider  amLODipine (NORVASC) 10 MG tablet Take 10 mg by mouth daily. 04/09/20  Yes [provider]  Cholecalciferol 125 MCG (5000 UT) TABS Take by mouth. 02/19/17  Yes [provider]  fluticasone (FLONASE) 50 MCG/ACT nasal spray 2 sprays by Each Nare  route daily as needed. 07/21/15  Yes [provider]  glipiZIDE (GLUCOTROL) 10 MG tablet Take 10 mg by mouth 2 (two) times daily before a meal. 06/12/10  Yes [provider]  linagliptin (TRADJENTA) 5 MG TABS tablet Take 1 tablet by mouth daily. 09/08/16  Yes [provider]  Omega-3 Fatty Acids (FISH OIL) 1000 MG CAPS Take by mouth.   Yes [provider]  propranolol (INDERAL) 20 MG tablet Take 20 mg by mouth daily.   Yes [provider]  ELIQUIS 5 MG TABS tablet Take 5 mg by mouth at bedtime. 11/20/19   [provider]  JARDIANCE 25 MG TABS tablet Take 25 mg by mouth every morning. 02/21/20   [provider]  pravastatin (PRAVACHOL) 20 MG tablet Take 20 mg by mouth at bedtime. 04/09/20   [provider]    Physical Exam: Vitals:   05/08/20 2340 05/09/20 0000 05/09/20 0148 05/09/20 0236  BP: (!) 146/86 127/77 133/67   Pulse: 93 86 86   Resp: 20 20 16    Temp:   99.5 F (37.5 C)   TempSrc:   Oral   SpO2: 95% 96% 97%   Weight:    90.9 kg  Height:    5\' 7"  (1.702 m)    Constitutional: NAD, calm, comfortable Eyes: PERRL, lids and conjunctivae normal ENMT: Mucous membranes are moist. Posterior pharynx clear of any exudate or lesions.Normal dentition.  Neck: normal, supple, no masses,  no thyromegaly Respiratory: clear to auscultation bilaterally, no wheezing, no crackles. Normal respiratory effort. No accessory muscle use.  Cardiovascular: Regular rate and rhythm, no murmurs / rubs / gallops. No extremity edema. 2+ pedal pulses. No carotid bruits.  Abdomen: no tenderness, no masses palpated. No hepatosplenomegaly. Bowel sounds positive.  Musculoskeletal: no clubbing / cyanosis. No joint deformity upper and lower extremities. Good ROM, no contractures. Normal muscle tone.  Skin: no rashes, lesions, ulcers. No induration Neurologic: CN 2-12 grossly intact. Sensation intact, DTR normal. Strength 5/5 in all 4.  Psychiatric:  Normal judgment and insight. Alert and oriented x 3. Normal mood.    Labs on Admission: I have personally reviewed following labs and imaging studies  CBC: Recent Labs  Lab 05/08/20 1908  WBC 8.3  HGB 14.2  HCT 40.9  MCV 86.7  PLT 147*   Basic Metabolic Panel: Recent Labs  Lab 05/08/20 1908  NA 135  K 3.3*  CL 89*  CO2 22  GLUCOSE 396*  BUN 38*  CREATININE 1.57*  CALCIUM 10.6*   GFR: Estimated Creatinine Clearance: 44.4 mL/min (A) (by C-G formula based on SCr of 1.57 mg/dL (H)). Liver Function Tests: Recent Labs  Lab 05/08/20 1908  AST 303*  ALT 172*  ALKPHOS 360*  BILITOT 5.9*  PROT 7.6  ALBUMIN 4.2   Recent Labs  Lab 05/08/20 1908  LIPASE 39   No results for input(s): AMMONIA in the last 168 hours. Coagulation Profile: Recent Labs  Lab 05/08/20 2345  INR 1.2   Cardiac Enzymes: No results for input(s): CKTOTAL, CKMB, CKMBINDEX, TROPONINI in the last 168 hours. BNP (last 3 results) No results for input(s): PROBNP in the last 8760 hours. HbA1C: No results for input(s): HGBA1C in the last 72 hours. CBG: Recent Labs  Lab 05/08/20 2324 05/09/20 0031 05/09/20 0142 05/09/20 0246  GLUCAP 305* 264* 272* 201*   Lipid Profile: No results for input(s): CHOL, HDL, LDLCALC, TRIG, CHOLHDL, LDLDIRECT in the last 72 hours. Thyroid Function Tests: No results for input(s): TSH, T4TOTAL, FREET4, T3FREE, THYROIDAB in the last 72 hours. Anemia Panel: No results for input(s): VITAMINB12, FOLATE, FERRITIN, TIBC, IRON, RETICCTPCT in the last 72 hours. Urine analysis:    Component Value Date/Time   COLORURINE YELLOW 05/08/2020 2345   APPEARANCEUR CLEAR 05/08/2020 2345   LABSPEC 1.015 05/08/2020 2345   PHURINE 5.0 05/08/2020 2345   GLUCOSEU >=500 (A) 05/08/2020 2345   HGBUR SMALL (A) 05/08/2020 2345   BILIRUBINUR NEGATIVE 05/08/2020 2345   KETONESUR 80 (A) 05/08/2020 2345   PROTEINUR NEGATIVE 05/08/2020 2345   NITRITE NEGATIVE 05/08/2020 2345    LEUKOCYTESUR NEGATIVE 05/08/2020 2345    Radiological Exams on Admission: CT Abdomen Pelvis Wo Contrast  Result Date: 05/08/2020 CLINICAL DATA:  Acute nonlocalized abdominal pain, constipation, transaminitis EXAM: CT ABDOMEN AND PELVIS WITHOUT CONTRAST TECHNIQUE: Multidetector CT imaging of the abdomen and pelvis was performed following the standard protocol without IV contrast. COMPARISON:  None. FINDINGS: Lower chest: The visualized lung bases are clear. Mediastinal shift to the left. Extensive multi-vessel coronary artery calcification. Mild global cardiomegaly. Metallic foreign body is seen within the right ventricular outflow tract. Mild pericardial calcification Hepatobiliary: Layering hyperdensity within the gallbladder likely represents sludge or layering gallstones. No pericholecystic inflammatory changes identified, however. The liver is unremarkable. No intra or extrahepatic biliary ductal dilation. 8 mm calcified calculus or debris is seen within the distal common duct in keeping with choledocholithiasis. Pancreas: Unremarkable Spleen: Unremarkable Adrenals/Urinary Tract: The adrenal glands are unremarkable. The kidneys  are normal in size and position. 2.4 cm exophytic simple cortical cyst arises from the lower pole of the left kidney. Immediately adjacent to this a homogeneously hyperdense lesion measuring 2.5 cm is seen most in keeping with a hyperdense renal cyst containing probable milk of calcium. The kidneys are otherwise unremarkable. Bladder unremarkable. Stomach/Bowel: Mild sigmoid diverticulosis. The stomach, small bowel, and large bowel are otherwise unremarkable. Appendix absent. No free intraperitoneal gas or fluid. Vascular/Lymphatic: Extensive aortoiliac atherosclerotic calcification. No aortic aneurysm. No pathologic adenopathy within the abdomen and pelvis. Reproductive: Prostate is unremarkable. Other: Moderate fat containing umbilical hernia. Mild soft tissue infiltration  involving the subcutaneous fat surrounding the hernia sac may relate to early changes of incarceration. Musculoskeletal: Degenerative changes are seen within the lumbar spine. No lytic or blastic bone lesion. IMPRESSION: Choledocholithiasis with 8 mm calculus or calcific debris within the distal duct at the level of the ampulla. Cholelithiasis. Moderate fat containing umbilical hernia with surrounding inflammatory stranding within the subcutaneous fat of the anterior abdominal wall possibly representing early changes of incarceration. Aortic Atherosclerosis (ICD10-I70.0). Electronically Signed   By: Helyn NumbersAshesh  Parikh MD   On: 05/08/2020 23:35   DG Chest 2 View  Result Date: 05/08/2020 CLINICAL DATA:  Chest pain EXAM: CHEST - 2 VIEW COMPARISON:  Chest x-ray dated 06/30/2017. FINDINGS: Stable cardiomegaly. Lungs are clear. No pleural effusion or pneumothorax is seen. Osseous structures about the chest are unremarkable. IMPRESSION: No active cardiopulmonary disease. No evidence of pneumonia or pulmonary edema. Stable cardiomegaly. Electronically Signed   By: Bary RichardStan  Maynard M.D.   On: 05/08/2020 18:26   US Abdomen Limited RUQ (LIVER/GB)  Result Date: 05/08/2020 CLINICAL DATA:  Transaminitis EXAM: ULTRASOUND ABDOMEN LIMITED RIGHT UPPER QUADRANT COMPARISON:  None. FINDINGS: Gallbladder: Gallbladder is moderately distended, with dependent gallbladder sludge identified. No shadowing gallstones. Borderline gallbladder wall thickening measuring 3 mm. No pericholecystic fluid. Negative sonographic Murphy sign. Common bile duct: Diameter: 7 mm Liver: Diffuse increased liver echotexture most consistent with hepatic steatosis. No focal liver abnormality. Portal vein is patent on color Doppler imaging with normal direction of blood flow towards the liver. Other: Incidental 1.8 cm simple right renal cyst. IMPRESSION: 1. Gallbladder sludge, with nonspecific borderline gallbladder wall thickening. No evidence of cholelithiasis or  cholecystitis. 2. Hepatic steatosis. Electronically Signed   By: Sharlet SalinaMichael  Brown M.D.   On: 05/08/2020 21:49    EKG: Independently reviewed.  Assessment/Plan Principal Problem:   DKA (diabetic ketoacidosis) (HCC) Active Problems:   Choledocholithiasis   HTN (hypertension)   A-fib (HCC)   CKD (chronic kidney disease) stage 3, GFR 30-59 ml/min (HCC)    1. DKA - 1. DKA pathway 2. Insulin gtt 3. Replace K ([facility] to go now, but K went from 3.3 to 2.7, so ordering 3 more runs). 4. BMP Q4H 5. IVF per pathway 2. Choledocholithiasis - 1. NPO 2. Continue empiric zosyn 3. Message sent to GI for AM consult 4. Repeat LFT in AM 5. Medtronic Pacemaker present, implanted in 2019, cardiology notes indicate that it is a "micra leadless pacemaker". 3. HTN - 1. Holding home BP meds for the moment 4. A.Fib - 1. Hold eliquis 2. Hold propranolol 3. Use short acting cardizem gtt if needed for RVR 1. But sounds like had more slow ventricular response in past anyhow (which landed him a pacemaker) 5. CKD 3 - 1. Chronic and stable  DVT prophylaxis: SCDs Code Status: Full Family Communication: No family in room Disposition Plan: Home after admit  Consults called: Message sent to Dr. Lavon Paganini Admission status: Admit to inpatient  Severity of Illness: The appropriate patient status for this patient is INPATIENT. Inpatient status is judged to be reasonable and necessary in order to provide the required intensity of service to ensure the patient's safety. The patient's presenting symptoms, physical exam findings, and initial radiographic and laboratory data in the context of their chronic comorbidities is felt to place them at high risk for further clinical deterioration. Furthermore, it is not anticipated that the patient will be medically stable for discharge from the hospital within 2 midnights of admission. The following factors support the patient status of inpatient.   IP status  for: 1) treatment of DKA 2) choledocholithiasis  * I certify that at the point of admission it is my clinical judgment that the patient will require inpatient hospital care spanning beyond 2 midnights from the point of admission due to high intensity of service, high risk for further deterioration and high frequency of surveillance required.*    Noma Quijas M. DO [facility]  How to contact the [facility] Attending or Consulting provider 7A - 7P or covering provider during after hours 7P -7A, for this patient?  1. Check the care team in [facility] and look for a) attending/consulting [facility] provider listed and b) the [facility] team listed 2. Log into [email or web address]  Amion Physician Scheduling and messaging for groups and whole hospitals  On call and physician scheduling software for group practices, residents, hospitalists and other medical providers for call, clinic, rotation and shift schedules. OnCall Enterprise is a hospital-wide system for scheduling doctors and paging doctors on call. EasyPlot is for scientific plotting and data analysis.  [email or web address]  and use [place]'s universal password to access. If you do not have the password, please contact the hospital operator.  3. Locate the [facility] provider you are looking for under [facility] and page to a number that you can be directly reached. 4. If you still have difficulty reaching the provider, please page the [facility] (Director on Call) for the Hospitalists listed on amion for assistance.  05/09/2020, 3:15 AM

## 2020-05-09 NOTE — TOC Initial Note (Signed)
Transition of Care [facility]) - Initial/Assessment Note    Patient Details  Name: Gabriel Duarte MRN: [id number] Date of Birth: 1946-02-25  Transition of Care [facility]) CM/SW Contact:    Golda Acre, RN Phone Number: 05/09/2020, 8:20 AM  Clinical Narrative:                 PCP: [facility]  Patient coming from: Home  I have personally briefly reviewed patient's old medical records in [facility] Link  Chief Complaint: N/V, CP  HPI: Gabriel Duarte is a 75 y.o. male with medical history significant of A.Fib on eliquis, PPM implant, CKD 3, DM2, HTN.  Pt presents to ED with c/o CP and epigastric abd pain onset 11am.  Patient states he was watching TV. Pain was felt across his chest, and was persisting until arriving to the ED. He states his began to ease off. He denies associated diaphoresis, shortness of breath.  Symptoms were severe, now improved.  Denies diarrhea (actually a bit constipated he relates).  No fevers, chills. Plan: to return home with self care,  Progression: insulin infusion protocol and K-runs. Expected Discharge Plan: Home/Self Care Barriers to Discharge: Continued Medical Work up   Patient Goals and CMS Choice Patient states their goals for this hospitalization and ongoing recovery are:: to go home CMS Medicare.gov Compare Post Acute Care list provided to:: Patient    Expected Discharge Plan and Services Expected Discharge Plan: Home/Self Care   Discharge Planning Services: CM Consult   Living arrangements for the past 2 months: Single Family Home                                      Prior Living Arrangements/Services Living arrangements for the past 2 months: Single Family Home Lives with:: Self Patient language and need for interpreter reviewed:: No Do you feel safe going back to the place where you live?: Yes      Need for Family Participation in Patient Care: Yes (Comment) Care giver support  system in place?: Yes (comment)   Criminal Activity/Legal Involvement Pertinent to Current Situation/Hospitalization: No - Comment as needed  Activities of Daily Living Home Assistive Devices/Equipment: Cane (specify quad or straight) ADL Screening (condition at time of admission) Patient's cognitive ability adequate to safely complete daily activities?: Yes Is the patient deaf or have difficulty hearing?: No Does the patient have difficulty seeing, even when wearing glasses/contacts?: No Does the patient have difficulty concentrating, remembering, or making decisions?: No Patient able to express need for assistance with ADLs?: No Does the patient have difficulty dressing or bathing?: Yes Independently performs ADLs?: Yes (appropriate for developmental age) Does the patient have difficulty walking or climbing stairs?: Yes Weakness of Legs: Both Weakness of Arms/Hands: None  Permission Sought/Granted                  Emotional Assessment Appearance:: Appears stated age Attitude/Demeanor/Rapport: Engaged Affect (typically observed): Calm Orientation: : Oriented to Self,Oriented to Place,Oriented to  Time,Oriented to Situation Alcohol / Substance Use: Not Applicable Psych Involvement: No (comment)  Admission diagnosis:  Choledocholithiasis [K80.50] Transaminitis [R74.01] Umbilical hernia without obstruction and without gangrene [K42.9] Constipation, unspecified constipation type [K59.00] DKA (diabetic ketoacidosis) (HCC) [E11.10] Patient Active Problem List   Diagnosis Date Noted  . Choledocholithiasis 05/09/2020  . DKA (diabetic ketoacidosis) (HCC) 05/09/2020  . HTN (hypertension) 05/09/2020  . A-fib (HCC) 05/09/2020  .  CKD (chronic kidney disease) stage 3, GFR 30-59 ml/min (HCC) 05/09/2020   PCP:  [facility] Pharmacy:   [facility] [address] [place] - [address] [place] [address] Phone:  [phone] Fax: [phone]     Social Determinants of Health (SDOH) Interventions    Readmission Risk Interventions No flowsheet data found.

## 2020-05-09 NOTE — Progress Notes (Signed)
Patient just arrived from [facility] via carelink. Paged on call admitting hospitalist, Dr. Julian Reil.

## 2020-05-09 NOTE — ED Notes (Signed)
Report given to [facility]. 

## 2020-05-09 NOTE — Progress Notes (Addendum)
75 year old male with history of type 2 diabetes, chronic A. fib on Eliquis, CKD stage IIIb, hypertension admitted with nausea vomiting chest pain and abdominal pain.  He is found to be in DKA and symptomatic choledocholithiasis.  LFTs are elevated. I have taken him off the insulin drip and placed him on Lantus 7 units daily.  I will continue IV fluids while he is n.p.o.  I will place him on clear liquids for today and n.p.o. after midnight.  Continue Zosyn. His hemoglobin A1c is 11.1 he is on maximum p.o. agents at home.  Glipizide 10 twice daily, Jardiance 25 mg daily, Tradjenta 5 mg daily.  His hemoglobin A1c is 11.1.  Appreciate diabetic coordinator input.  Patient is very hesitant to starting insulin however he is thinking about it.  With some cases of DKA in patients taking Jardiance and with uncontrolled type 2 diabetes he is a good candidate for insulin.  Hopefully he will agree to insulin on discharge.

## 2020-05-09 NOTE — Anesthesia Preprocedure Evaluation (Addendum)
Anesthesia Evaluation  Patient identified by MRN, date of birth, ID band Patient awake    Reviewed: Allergy & Precautions, NPO status , Patient's Chart, lab work & pertinent test results  History of Anesthesia Complications Negative for: history of anesthetic complications  Airway Mallampati: II  TM Distance: >3 FB Neck ROM: Full    Dental  (+) Dental Advisory Given   Pulmonary former smoker,    Pulmonary exam normal        Cardiovascular hypertension, Pt. on medications and Pt. on home beta blockers + dysrhythmias Atrial Fibrillation + pacemaker  Rhythm:Irregular Rate:Normal     Neuro/Psych negative neurological ROS  negative psych ROS   GI/Hepatic negative GI ROS, Neg liver ROS,   Endo/Other  diabetes, Type 2, Oral Hypoglycemic Agents Obesity   Renal/GU CRFRenal disease     Musculoskeletal negative musculoskeletal ROS (+)   Abdominal   Peds  Hematology  On eliquis    Anesthesia Other Findings Covid test negative   Reproductive/Obstetrics                            Anesthesia Physical Anesthesia Plan  ASA: III  Anesthesia Plan: General   Post-op Pain Management:    Induction: Intravenous  PONV Risk Score and Plan: 2 and Treatment may vary due to age or medical condition, Ondansetron and Dexamethasone  Airway Management Planned: Oral ETT  Additional Equipment: None  Intra-op Plan:   Post-operative Plan: Extubation in OR  Informed Consent: I have reviewed the patients History and Physical, chart, labs and discussed the procedure including the risks, benefits and alternatives for the proposed anesthesia with the patient or authorized representative who has indicated his/her understanding and acceptance.     Dental advisory given  Plan Discussed with: CRNA and Anesthesiologist  Anesthesia Plan Comments:        Anesthesia Quick Evaluation

## 2020-05-09 NOTE — Progress Notes (Signed)
Patient's HR dropped below 50 (pacemaker set rate 50-VVI), communicated with [employer] rep, Kathlene November [phone]) and was notified that rate hysteresis feature is on. Per rep, okay for rate to drop below 50, but should never drop below 30. Will continue to monitor.  Pacemaker information (provided by patient's girlfriend from home)  Placed April 29th, 2019 Medtronic Serial # [id number] S Model # [id number]

## 2020-05-09 NOTE — Progress Notes (Signed)
Pharmacy Antibiotic Note  Gabriel Duarte is a 75 y.o. male admitted on 05/08/2020 with intra-abdominal infection.  Pharmacy has been consulted for Zosyn dosing.  Plan: Zosyn 3.375g IV q8h (4-hour infusion).  Height: 5\' 7"  (170.2 cm) Weight: 92.1 kg (203 lb) IBW/kg (Calculated) : 66.1  Temp (24hrs), Avg:97.6 F (36.4 C), Min:97.6 F (36.4 C), Max:97.6 F (36.4 C)  Recent Labs  Lab 05/08/20 1908  WBC 8.3  CREATININE 1.57*    Estimated Creatinine Clearance: 44.7 mL/min (A) (by C-G formula based on SCr of 1.57 mg/dL (H)).    No Known Allergies   Thank you for allowing pharmacy to be a part of this patient's care.  07/08/20, PharmD, BCPS  05/09/2020 12:03 AM

## 2020-05-09 NOTE — Consult Note (Addendum)
Referring Provider: Dr. Jacki Cones  Primary Care Physician:  [facility] Primary Gastroenterologist:  Althia Forts   Reason for Consultation:  Choledocholithiasis   HPI: Gabriel Duarte is a 75 y.o. 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   He presented to the ED 05/08/2020 with chest and epigastric pain.  He had constipation for a few days so he took Metamucil without improvement.  Yesterday he took 3 to 4 teaspoons of Epsom salts followed by drinking a diet soda to facilitate a bowel movement.  He ate tomatoes and cucumbers and around 11 AM hand shortly after he developed chest pain and vomited up the partially digested cucumbers and tomato.  No frank hematemesis.  No BM. He presented to the ED for further evaluation regarding chest pain.  Labs in the ED showed alk phosphatase level 360.  Total bili 5.9.  AST 303.  ALT 172.  Albumin 4.2.  Lipase 39. Sodium 135.  Potassium 3.3.  Glucose 396.  BUN 38.  Creatinine 1.57.  WBC 8.3.  Hemoglobin 14.2.  Platelet 147.  INR 1.2.  Urine bilirubin level negative.  Troponin levels were unremarkable.  SARS coronavirus 2 negative.  Influenza A & B negative.  A 12 lead EKG showed afib without acute ischemia. A chest x-ray showed stable cardiomegaly otherwise was negative.  A right upper quadrant sonogram showed gallbladder sludge with nonspecific borderline gallbladder wall thickening without evidence of acute cholecystitis are cholelithiasis.  Hepatic steatosis was noted.  An abdominal/pelvic CT without contrast showed cholelithiasis, choledocholithiasis with an 8 mm calculus or calcific debris within the distal duct at the level of the ampulla.  The umbilical hernia containing fat with surrounding inflammatory stranding within the subcutaneous  fat of the anterior abdominal wall was identified concerning for possible early hernia incarceration.  His laboratory studies were consistent with DKA and he was admitted to the ICU for insulin drip, KCL IV and hydration.  A GI consult was requested for further evaluation regarding elevated LFTs in setting of choledocholithiasis per CT.  Currently, he reports feeling quite well.  His chest pain abated after he vomited x1 as noted above.  No current nausea.  No upper or lower abdominal pain.  He typically passes a normal brown bowel movement every day or every other day with intermittent constipation.  No rectal bleeding or melena.  He has a large umbilical hernia which has been present for at least 4 years  and does not cause him any pain.  No cough or shortness of breath.  He has a history of atrial fibrillation and he last took Eliquis around 6 PM Tuesday, 05/07/2020.  He has a history of a GI bleed which occurred while on anticoagulation during his hospital admission at [facility] post pacemaker placement 06/2017.  An EGD showed prominent veins in the proximal esophagus and the stomach and duodenum were unremarkable.  He underwent a colonoscopy which identified a 10 mm tubular adenomatous polyp which was removed from the ascending colon and moderate sigmoid diverticulosis with pancolonic diverticulosis and internal hemorrhoids.  He reports unintentional losing 8 pounds over the past 3 months.  No fever, sweats or chills.  No family history of esophageal, gastric or colorectal cancer.    Past Medical History:  Diagnosis Date  . Atrial fibrillation ([place])   . CKD (chronic kidney  disease) stage 3, GFR 30-59 ml/min ([facility])   . Diabetes mellitus without complication ([place])   . High cholesterol   . Hypertension     Past Surgical History:  Procedure Laterality Date  . PACEMAKER IMPLANT     For Tachybrady    Prior to Admission medications   Medication Sig Start Date End Date  Taking? Authorizing Provider  amLODipine (NORVASC) 10 MG tablet Take 10 mg by mouth daily. 04/09/20  Yes [provider]  Cholecalciferol 125 MCG (5000 UT) TABS Take 1 tablet by mouth daily. 02/19/17  Yes [provider]  ELIQUIS 5 MG TABS tablet Take 5 mg by mouth at bedtime. 11/20/19  Yes [provider]  glipiZIDE (GLUCOTROL) 10 MG tablet Take 10 mg by mouth 2 (two) times daily before a meal. 06/12/10  Yes [provider]  JARDIANCE 25 MG TABS tablet Take 25 mg by mouth every morning. 02/21/20  Yes [provider]  linagliptin (TRADJENTA) 5 MG TABS tablet Take 1 tablet by mouth daily. 09/08/16  Yes [provider]  Omega-3 Fatty Acids (FISH OIL) 1000 MG CAPS Take 1 capsule by mouth daily.   Yes [provider]  pravastatin (PRAVACHOL) 20 MG tablet Take 20 mg by mouth at bedtime. 04/09/20  Yes [provider]  propranolol (INDERAL) 20 MG tablet Take 20 mg by mouth daily.   Yes [provider]  vitamin B-12 (CYANOCOBALAMIN) 1000 MCG tablet Take 1,000 mcg by mouth daily.   Yes [provider]    Current Facility-Administered Medications  Medication Dose Route Frequency Provider Last Rate Last Admin  . Chlorhexidine Gluconate Cloth 2 % PADS 6 each  6 each Topical Daily Shela Leff, MD      . dextrose 5 % in lactated ringers infusion   Intravenous Continuous Robinson, [place] N, PA-C 125 mL/hr at 05/09/20 0500 Infusion Verify at 05/09/20 0500  . dextrose 50 % solution 0-50 mL  0-50 mL Intravenous PRN Robinson, [place] N, PA-C      . insulin regular, human (MYXREDLIN) 100 units/ 100 mL infusion   Intravenous Continuous Robinson, [place] N, PA-C 2.4 mL/hr at 05/09/20 0500 Infusion Verify at 05/09/20 0500  . lactated ringers infusion   Intravenous Continuous Robinson, [place] N, PA-C   Stopped at 05/09/20 0255  . MEDLINE mouth rinse  15 mL Mouth Rinse BID Shela Leff, MD   15 mL at 05/09/20 0314  .  piperacillin-tazobactam (ZOSYN) IVPB 3.375 g  3.375 g Intravenous Q8H Bryk, Veronda P, RPH 12.5 mL/hr at 05/09/20 0531 3.375 g at 05/09/20 0531  . potassium chloride 10 mEq in 100 mL IVPB  10 mEq Intravenous Q1 Hr x 4 Etta Quill, DO 100 mL/hr at 05/09/20 0637 10 mEq at 05/09/20 2919    Allergies as of 05/08/2020  . (No Known Allergies)    Family history is noncontributory  Social History   Social History Narrative   Single former smoker no alcohol tobacco or drug use now     Review of Systems: Gen: See HPI. CV: Denies chest pain, palpitations or edema. Resp: See HPI. GI: + Epigastric pain 3/2, resolved. + Constipation.  No rectal bleeding or melena.  Denies heartburn, dysphagia, stomach or lower abdominal pain. GU : Denies urinary burning, blood in urine, increased urinary frequency or incontinence. MS: Denies joint pain, muscles aches or weakness. Derm: Denies rash, itchiness, skin lesions or unhealing ulcers. Psych: Denies depression, anxiety or memory loss. Heme: Denies easy bruising, bleeding. Neuro:  Denies  headaches, dizziness or paresthesias. Endo:  + DM II.  Physical Exam: Vital signs in last 24 hours: Temp:  [97.6 F (36.4 C)-99.5 F (37.5 C)] 98.6 F (37 C) (03/03 0400) Pulse Rate:  [68-100] 74 (03/03 0600) Resp:  [14-24] 16 (03/03 0600) BP: (107-146)/(28-90) 122/88 (03/03 0600) SpO2:  [90 %-99 %] 94 % (03/03 0600) Weight:  [90.9 kg-92.1 kg] 90.9 kg (03/03 0236)   General:  Alert 75 year old male sitting up in the chair in no acute distress. Head:  Normocephalic and atraumatic. Eyes: Mild scleral icterus. Conjunctiva pink. Ears:  Normal auditory acuity. Nose:  No deformity, discharge or lesions. Mouth:  Dentition intact. No ulcers or lesions.  Neck:  Supple. No lymphadenopathy or thyromegaly.  Lungs: Breath sounds clear throughout. Heart: Irregular rhythm,  no murmurs. Abdomen: Soft, nondistended.  Nontender.  Large tennis ball size umbilical hernia  protrudes and is not reducible. Rectal: Deferred. Musculoskeletal:  Symmetrical without gross deformities.  Pulses:  Normal pulses noted. Extremities: 1+  Bilateral LEs edema.  Neurologic:  Alert and  oriented x4. No focal deficits.  Skin:  Intact without significant lesions or rashes. Psych:  Alert and cooperative. Normal mood and affect.   Lab Results: Recent Labs    05/08/20 1908  WBC 8.3  HGB 14.2  HCT 40.9  PLT 147*   BMET Recent Labs    05/08/20 1908 05/09/20 0300  NA 135 138  K 3.3* 3.3*  CL 89* 97*  CO2 22 25  GLUCOSE 396* 210*  BUN 38* 34*  CREATININE 1.57* 1.40*  CALCIUM 10.6* 9.5   LFT Recent Labs    05/08/20 1908  PROT 7.6  ALBUMIN 4.2  AST 303*  ALT 172*  ALKPHOS 360*  BILITOT 5.9*   PT/INR Recent Labs    05/08/20 2345  LABPROT 14.5  INR 1.2    Lab Results  Component Value Date   LIPASE 39 05/08/2020    Studies/Results:-Images personally viewed Gatha Mayer, MD, [facility]  CT Abdomen Pelvis Wo Contrast  Result Date: 05/08/2020 CLINICAL DATA:  Acute nonlocalized abdominal pain, constipation, transaminitis EXAM: CT ABDOMEN AND PELVIS WITHOUT CONTRAST TECHNIQUE: Multidetector CT imaging of the abdomen and pelvis was performed following the standard protocol without IV contrast. COMPARISON:  None. FINDINGS: Lower chest: The visualized lung bases are clear. Mediastinal shift to the left. Extensive multi-vessel coronary artery calcification. Mild global cardiomegaly. Metallic foreign body is seen within the right ventricular outflow tract. Mild pericardial calcification Hepatobiliary: Layering hyperdensity within the gallbladder likely represents sludge or layering gallstones. No pericholecystic inflammatory changes identified, however. The liver is unremarkable. No intra or extrahepatic biliary ductal dilation. 8 mm calcified calculus or debris is seen within the distal common duct in keeping with choledocholithiasis. Pancreas: Unremarkable Spleen:  Unremarkable Adrenals/Urinary Tract: The adrenal glands are unremarkable. The kidneys are normal in size and position. 2.4 cm exophytic simple cortical cyst arises from the lower pole of the left kidney. Immediately adjacent to this a homogeneously hyperdense lesion measuring 2.5 cm is seen most in keeping with a hyperdense renal cyst containing probable milk of calcium. The kidneys are otherwise unremarkable. Bladder unremarkable. Stomach/Bowel: Mild sigmoid diverticulosis. The stomach, small bowel, and large bowel are otherwise unremarkable. Appendix absent. No free intraperitoneal gas or fluid. Vascular/Lymphatic: Extensive aortoiliac atherosclerotic calcification. No aortic aneurysm. No pathologic adenopathy within the abdomen and pelvis. Reproductive: Prostate is unremarkable. Other: Moderate fat containing umbilical hernia. Mild soft tissue infiltration involving the subcutaneous fat surrounding the hernia sac may relate to early  changes of incarceration. Musculoskeletal: Degenerative changes are seen within the lumbar spine. No lytic or blastic bone lesion. IMPRESSION: Choledocholithiasis with 8 mm calculus or calcific debris within the distal duct at the level of the ampulla. Cholelithiasis. Moderate fat containing umbilical hernia with surrounding inflammatory stranding within the subcutaneous fat of the anterior abdominal wall possibly representing early changes of incarceration. Aortic Atherosclerosis (ICD10-I70.0). Electronically Signed   By: Fidela Salisbury MD   On: 05/08/2020 23:35   DG Chest 2 View  Result Date: 05/08/2020 CLINICAL DATA:  Chest pain EXAM: CHEST - 2 VIEW COMPARISON:  Chest x-ray dated 06/30/2017. FINDINGS: Stable cardiomegaly. Lungs are clear. No pleural effusion or pneumothorax is seen. Osseous structures about the chest are unremarkable. IMPRESSION: No active cardiopulmonary disease. No evidence of pneumonia or pulmonary edema. Stable cardiomegaly. Electronically Signed   By: Franki Cabot M.D.   On: 05/08/2020 18:26   US Abdomen Limited RUQ (LIVER/GB)  Result Date: 05/08/2020 CLINICAL DATA:  Transaminitis EXAM: ULTRASOUND ABDOMEN LIMITED RIGHT UPPER QUADRANT COMPARISON:  None. FINDINGS: Gallbladder: Gallbladder is moderately distended, with dependent gallbladder sludge identified. No shadowing gallstones. Borderline gallbladder wall thickening measuring 3 mm. No pericholecystic fluid. Negative sonographic Murphy sign. Common bile duct: Diameter: 7 mm Liver: Diffuse increased liver echotexture most consistent with hepatic steatosis. No focal liver abnormality. Portal vein is patent on color Doppler imaging with normal direction of blood flow towards the liver. Other: Incidental 1.8 cm simple right renal cyst. IMPRESSION: 1. Gallbladder sludge, with nonspecific borderline gallbladder wall thickening. No evidence of cholelithiasis or cholecystitis. 2. Hepatic steatosis. Electronically Signed   By: Randa Ngo M.D.   On: 05/08/2020 21:49    IMPRESSION/PLAN:  29.  75 year old male with chest/epigastric pain, N/V and elevated alk phos, total bili and LFTs.  Alk phos 360.  AST 303.  ALT 172.  Total bili 5.9.  Normal lipase.  CTAP consistent with gallstones and choledocholithiasis.  No leukocytosis.  He is afebrile and hemodynamically stable. -N.p.o. after midnight -ERCP Friday, 05/10/2020 at 7:30 AM with Dr. Carlean Purl -IV fluids per the hospitalist -Continue Zosyn 3.375 mg IV every 8 hours -Repeat CBC, hepatic panel in a.m. -Further recommendations per Dr. Carlean Purl  2.  DKA admitted to the ICU on insulin drip, IV potassium, IV fluids with BMP every 4 hours  3.  Atrial fibrillation.  Last dose of Eliquis was at 6 PM on 3/2.  4.S/P pacemaker 06/2017  5. CKD stage III, stable. Cr 1.32.   6. History of a 10 mm tubular adenomatous colon polyp per colonoscopy 06/2017 -Eventual repeat colonoscopy as an outpatient  7. Constipation -Miralax 17gm po Q HS PRN  8.  Large umbilical  hernia containing fat   Noralyn Pick  05/09/2020, 09:32 AM      [facility] Attending   I have taken an interval history, reviewed the chart and examined the patient. I agree with the Advanced Practitioner's note, impression and recommendations and have discussed with her.    He has symptomatic choledocholithiasis in the setting of DKA and Eliquis use.  He also has chronic kidney disease.  He needs an ERCP tentatively we will do this tomorrow.The risks and benefits as well as alternatives of endoscopic procedure(s) have been discussed and reviewed. All questions answered. The patient agrees to proceed. The timing of ERCP will depend upon the treatment status of his DKA most likely.  As long as that is resolved we should be able to proceed.   Gatha Mayer, MD,  FACG [facility] 05/09/2020 9:42 AM    

## 2020-05-09 NOTE — Plan of Care (Signed)
[facility] will assume care on arrival to accepting facility. Until arrival, care as per EDP. However, [facility] available 24/7 for questions and assistance.  

## 2020-05-09 NOTE — H&P (View-Only) (Signed)
Referring Provider: Dr. Jacki Cones  Primary Care Physician:  [facility] Primary Gastroenterologist:  Althia Forts   Reason for Consultation:  Choledocholithiasis   HPI: Gabriel Duarte is a 75 y.o. 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   He presented to the ED 05/08/2020 with chest and epigastric pain.  He had constipation for a few days so he took Metamucil without improvement.  Yesterday he took 3 to 4 teaspoons of Epsom salts followed by drinking a diet soda to facilitate a bowel movement.  He ate tomatoes and cucumbers and around 11 AM hand shortly after he developed chest pain and vomited up the partially digested cucumbers and tomato.  No frank hematemesis.  No BM. He presented to the ED for further evaluation regarding chest pain.  Labs in the ED showed alk phosphatase level 360.  Total bili 5.9.  AST 303.  ALT 172.  Albumin 4.2.  Lipase 39. Sodium 135.  Potassium 3.3.  Glucose 396.  BUN 38.  Creatinine 1.57.  WBC 8.3.  Hemoglobin 14.2.  Platelet 147.  INR 1.2.  Urine bilirubin level negative.  Troponin levels were unremarkable.  SARS coronavirus 2 negative.  Influenza A & B negative.  A 12 lead EKG showed afib without acute ischemia. A chest x-ray showed stable cardiomegaly otherwise was negative.  A right upper quadrant sonogram showed gallbladder sludge with nonspecific borderline gallbladder wall thickening without evidence of acute cholecystitis are cholelithiasis.  Hepatic steatosis was noted.  An abdominal/pelvic CT without contrast showed cholelithiasis, choledocholithiasis with an 8 mm calculus or calcific debris within the distal duct at the level of the ampulla.  The umbilical hernia containing fat with surrounding inflammatory stranding within the subcutaneous  fat of the anterior abdominal wall was identified concerning for possible early hernia incarceration.  His laboratory studies were consistent with DKA and he was admitted to the ICU for insulin drip, KCL IV and hydration.  A GI consult was requested for further evaluation regarding elevated LFTs in setting of choledocholithiasis per CT.  Currently, he reports feeling quite well.  His chest pain abated after he vomited x1 as noted above.  No current nausea.  No upper or lower abdominal pain.  He typically passes a normal brown bowel movement every day or every other day with intermittent constipation.  No rectal bleeding or melena.  He has a large umbilical hernia which has been present for at least 4 years  and does not cause him any pain.  No cough or shortness of breath.  He has a history of atrial fibrillation and he last took Eliquis around 6 PM Tuesday, 05/07/2020.  He has a history of a GI bleed which occurred while on anticoagulation during his hospital admission at [facility] post pacemaker placement 06/2017.  An EGD showed prominent veins in the proximal esophagus and the stomach and duodenum were unremarkable.  He underwent a colonoscopy which identified a 10 mm tubular adenomatous polyp which was removed from the ascending colon and moderate sigmoid diverticulosis with pancolonic diverticulosis and internal hemorrhoids.  He reports unintentional losing 8 pounds over the past 3 months.  No fever, sweats or chills.  No family history of esophageal, gastric or colorectal cancer.    Past Medical History:  Diagnosis Date  . Atrial fibrillation ([place])   . CKD (chronic kidney  disease) stage 3, GFR 30-59 ml/min (HCC)   . Diabetes mellitus without complication ([place])   . High cholesterol   . Hypertension     Past Surgical History:  Procedure Laterality Date  . PACEMAKER IMPLANT     For Tachybrady    Prior to Admission medications   Medication Sig Start Date End Date  Taking? Authorizing Provider  amLODipine (NORVASC) 10 MG tablet Take 10 mg by mouth daily. 04/09/20  Yes [provider]  Cholecalciferol 125 MCG (5000 UT) TABS Take 1 tablet by mouth daily. 02/19/17  Yes [provider]  ELIQUIS 5 MG TABS tablet Take 5 mg by mouth at bedtime. 11/20/19  Yes [provider]  glipiZIDE (GLUCOTROL) 10 MG tablet Take 10 mg by mouth 2 (two) times daily before a meal. 06/12/10  Yes [provider]  JARDIANCE 25 MG TABS tablet Take 25 mg by mouth every morning. 02/21/20  Yes [provider]  linagliptin (TRADJENTA) 5 MG TABS tablet Take 1 tablet by mouth daily. 09/08/16  Yes [provider]  Omega-3 Fatty Acids (FISH OIL) 1000 MG CAPS Take 1 capsule by mouth daily.   Yes [provider]  pravastatin (PRAVACHOL) 20 MG tablet Take 20 mg by mouth at bedtime. 04/09/20  Yes [provider]  propranolol (INDERAL) 20 MG tablet Take 20 mg by mouth daily.   Yes [provider]  vitamin B-12 (CYANOCOBALAMIN) 1000 MCG tablet Take 1,000 mcg by mouth daily.   Yes [provider]    Current Facility-Administered Medications  Medication Dose Route Frequency Provider Last Rate Last Admin  . Chlorhexidine Gluconate Cloth 2 % PADS 6 each  6 each Topical Daily Shela Leff, MD      . dextrose 5 % in lactated ringers infusion   Intravenous Continuous Robinson, [place] N, PA-C 125 mL/hr at 05/09/20 0500 Infusion Verify at 05/09/20 0500  . dextrose 50 % solution 0-50 mL  0-50 mL Intravenous PRN Robinson, [place] N, PA-C      . insulin regular, human (MYXREDLIN) 100 units/ 100 mL infusion   Intravenous Continuous Robinson, [place] N, PA-C 2.4 mL/hr at 05/09/20 0500 Infusion Verify at 05/09/20 0500  . lactated ringers infusion   Intravenous Continuous Robinson, [place] N, PA-C   Stopped at 05/09/20 0255  . MEDLINE mouth rinse  15 mL Mouth Rinse BID Shela Leff, MD   15 mL at 05/09/20 0314  .  piperacillin-tazobactam (ZOSYN) IVPB 3.375 g  3.375 g Intravenous Q8H Bryk, Veronda P, RPH 12.5 mL/hr at 05/09/20 0531 3.375 g at 05/09/20 0531  . potassium chloride 10 mEq in 100 mL IVPB  10 mEq Intravenous Q1 Hr x 4 Etta Quill, DO 100 mL/hr at 05/09/20 0637 10 mEq at 05/09/20 2919    Allergies as of 05/08/2020  . (No Known Allergies)    Family history is noncontributory  Social History   Social History Narrative   Single former smoker no alcohol tobacco or drug use now     Review of Systems: Gen: See HPI. CV: Denies chest pain, palpitations or edema. Resp: See HPI. GI: + Epigastric pain 3/2, resolved. + Constipation.  No rectal bleeding or melena.  Denies heartburn, dysphagia, stomach or lower abdominal pain. GU : Denies urinary burning, blood in urine, increased urinary frequency or incontinence. MS: Denies joint pain, muscles aches or weakness. Derm: Denies rash, itchiness, skin lesions or unhealing ulcers. Psych: Denies depression, anxiety or memory loss. Heme: Denies easy bruising, bleeding. Neuro:  Denies  headaches, dizziness or paresthesias. Endo:  + DM II.  Physical Exam: Vital signs in last 24 hours: Temp:  [97.6 F (36.4 C)-99.5 F (37.5 C)] 98.6 F (37 C) (03/03 0400) Pulse Rate:  [68-100] 74 (03/03 0600) Resp:  [14-24] 16 (03/03 0600) BP: (107-146)/(28-90) 122/88 (03/03 0600) SpO2:  [90 %-99 %] 94 % (03/03 0600) Weight:  [90.9 kg-92.1 kg] 90.9 kg (03/03 0236)   General:  Alert 75 year old male sitting up in the chair in no acute distress. Head:  Normocephalic and atraumatic. Eyes: Mild scleral icterus. Conjunctiva pink. Ears:  Normal auditory acuity. Nose:  No deformity, discharge or lesions. Mouth:  Dentition intact. No ulcers or lesions.  Neck:  Supple. No lymphadenopathy or thyromegaly.  Lungs: Breath sounds clear throughout. Heart: Irregular rhythm,  no murmurs. Abdomen: Soft, nondistended.  Nontender.  Large tennis ball size umbilical hernia  protrudes and is not reducible. Rectal: Deferred. Musculoskeletal:  Symmetrical without gross deformities.  Pulses:  Normal pulses noted. Extremities: 1+  Bilateral LEs edema.  Neurologic:  Alert and  oriented x4. No focal deficits.  Skin:  Intact without significant lesions or rashes. Psych:  Alert and cooperative. Normal mood and affect.   Lab Results: Recent Labs    05/08/20 1908  WBC 8.3  HGB 14.2  HCT 40.9  PLT 147*   BMET Recent Labs    05/08/20 1908 05/09/20 0300  NA 135 138  K 3.3* 3.3*  CL 89* 97*  CO2 22 25  GLUCOSE 396* 210*  BUN 38* 34*  CREATININE 1.57* 1.40*  CALCIUM 10.6* 9.5   LFT Recent Labs    05/08/20 1908  PROT 7.6  ALBUMIN 4.2  AST 303*  ALT 172*  ALKPHOS 360*  BILITOT 5.9*   PT/INR Recent Labs    05/08/20 2345  LABPROT 14.5  INR 1.2    Lab Results  Component Value Date   LIPASE 39 05/08/2020    Studies/Results:-Images personally viewed Gatha Mayer, MD, [facility]  CT Abdomen Pelvis Wo Contrast  Result Date: 05/08/2020 CLINICAL DATA:  Acute nonlocalized abdominal pain, constipation, transaminitis EXAM: CT ABDOMEN AND PELVIS WITHOUT CONTRAST TECHNIQUE: Multidetector CT imaging of the abdomen and pelvis was performed following the standard protocol without IV contrast. COMPARISON:  None. FINDINGS: Lower chest: The visualized lung bases are clear. Mediastinal shift to the left. Extensive multi-vessel coronary artery calcification. Mild global cardiomegaly. Metallic foreign body is seen within the right ventricular outflow tract. Mild pericardial calcification Hepatobiliary: Layering hyperdensity within the gallbladder likely represents sludge or layering gallstones. No pericholecystic inflammatory changes identified, however. The liver is unremarkable. No intra or extrahepatic biliary ductal dilation. 8 mm calcified calculus or debris is seen within the distal common duct in keeping with choledocholithiasis. Pancreas: Unremarkable Spleen:  Unremarkable Adrenals/Urinary Tract: The adrenal glands are unremarkable. The kidneys are normal in size and position. 2.4 cm exophytic simple cortical cyst arises from the lower pole of the left kidney. Immediately adjacent to this a homogeneously hyperdense lesion measuring 2.5 cm is seen most in keeping with a hyperdense renal cyst containing probable milk of calcium. The kidneys are otherwise unremarkable. Bladder unremarkable. Stomach/Bowel: Mild sigmoid diverticulosis. The stomach, small bowel, and large bowel are otherwise unremarkable. Appendix absent. No free intraperitoneal gas or fluid. Vascular/Lymphatic: Extensive aortoiliac atherosclerotic calcification. No aortic aneurysm. No pathologic adenopathy within the abdomen and pelvis. Reproductive: Prostate is unremarkable. Other: Moderate fat containing umbilical hernia. Mild soft tissue infiltration involving the subcutaneous fat surrounding the hernia sac may relate to early  changes of incarceration. Musculoskeletal: Degenerative changes are seen within the lumbar spine. No lytic or blastic bone lesion. IMPRESSION: Choledocholithiasis with 8 mm calculus or calcific debris within the distal duct at the level of the ampulla. Cholelithiasis. Moderate fat containing umbilical hernia with surrounding inflammatory stranding within the subcutaneous fat of the anterior abdominal wall possibly representing early changes of incarceration. Aortic Atherosclerosis (ICD10-I70.0). Electronically Signed   By: Fidela Salisbury MD   On: 05/08/2020 23:35   DG Chest 2 View  Result Date: 05/08/2020 CLINICAL DATA:  Chest pain EXAM: CHEST - 2 VIEW COMPARISON:  Chest x-ray dated 06/30/2017. FINDINGS: Stable cardiomegaly. Lungs are clear. No pleural effusion or pneumothorax is seen. Osseous structures about the chest are unremarkable. IMPRESSION: No active cardiopulmonary disease. No evidence of pneumonia or pulmonary edema. Stable cardiomegaly. Electronically Signed   By: Franki Cabot M.D.   On: 05/08/2020 18:26   US Abdomen Limited RUQ (LIVER/GB)  Result Date: 05/08/2020 CLINICAL DATA:  Transaminitis EXAM: ULTRASOUND ABDOMEN LIMITED RIGHT UPPER QUADRANT COMPARISON:  None. FINDINGS: Gallbladder: Gallbladder is moderately distended, with dependent gallbladder sludge identified. No shadowing gallstones. Borderline gallbladder wall thickening measuring 3 mm. No pericholecystic fluid. Negative sonographic Murphy sign. Common bile duct: Diameter: 7 mm Liver: Diffuse increased liver echotexture most consistent with hepatic steatosis. No focal liver abnormality. Portal vein is patent on color Doppler imaging with normal direction of blood flow towards the liver. Other: Incidental 1.8 cm simple right renal cyst. IMPRESSION: 1. Gallbladder sludge, with nonspecific borderline gallbladder wall thickening. No evidence of cholelithiasis or cholecystitis. 2. Hepatic steatosis. Electronically Signed   By: Randa Ngo M.D.   On: 05/08/2020 21:49    IMPRESSION/PLAN:  29.  75 year old male with chest/epigastric pain, N/V and elevated alk phos, total bili and LFTs.  Alk phos 360.  AST 303.  ALT 172.  Total bili 5.9.  Normal lipase.  CTAP consistent with gallstones and choledocholithiasis.  No leukocytosis.  He is afebrile and hemodynamically stable. -N.p.o. after midnight -ERCP Friday, 05/10/2020 at 7:30 AM with Dr. Carlean Purl -IV fluids per the hospitalist -Continue Zosyn 3.375 mg IV every 8 hours -Repeat CBC, hepatic panel in a.m. -Further recommendations per Dr. Carlean Purl  2.  DKA admitted to the ICU on insulin drip, IV potassium, IV fluids with BMP every 4 hours  3.  Atrial fibrillation.  Last dose of Eliquis was at 6 PM on 3/2.  4.S/P pacemaker 06/2017  5. CKD stage III, stable. Cr 1.32.   6. History of a 10 mm tubular adenomatous colon polyp per colonoscopy 06/2017 -Eventual repeat colonoscopy as an outpatient  7. Constipation -Miralax 17gm po Q HS PRN  8.  Large umbilical  hernia containing fat   Noralyn Pick  05/09/2020, 09:32 AM      [facility] Attending   I have taken an interval history, reviewed the chart and examined the patient. I agree with the Advanced Practitioner's note, impression and recommendations and have discussed with her.    He has symptomatic choledocholithiasis in the setting of DKA and Eliquis use.  He also has chronic kidney disease.  He needs an ERCP tentatively we will do this tomorrow.The risks and benefits as well as alternatives of endoscopic procedure(s) have been discussed and reviewed. All questions answered. The patient agrees to proceed. The timing of ERCP will depend upon the treatment status of his DKA most likely.  As long as that is resolved we should be able to proceed.   Gatha Mayer, MD,  [facility] 05/09/2020 9:42 AM

## 2020-05-09 NOTE — Progress Notes (Signed)
Inpatient Diabetes Program Recommendations  AACE/ADA: New Consensus Statement on Inpatient Glycemic Control (2015)  Target Ranges:  Prepandial:   less than 140 mg/dL      Peak postprandial:   less than 180 mg/dL (1-2 hours)      Critically ill patients:  140 - 180 mg/dL   Lab Results  Component Value Date   GLUCAP 157 (H) 05/09/2020   HGBA1C 11.1 (H) 05/09/2020    Review of Glycemic Control  Diabetes history: DM2 Outpatient Diabetes medications: glipizide 10 mg BID, Jardiance 25 mg QAM, tradjenta 5 mg QD Current orders for Inpatient glycemic control: IV insulin - transitioning to Lantus 7 units QD  HgbA1C - 11.1%  ERCP on 3/4 for gallstones Pt will be NPO after MN   Inpatient Diabetes Program Recommendations:     Increase Lantus to 7 units BID (starting 3/4) Add Novolog 0-9 units Q4H May need Novolog 2 units TID for CL diet today.  Long discussion with pt about going home on insulin. He said his PCP has discussed with him, but he's opposed to it. I explained why he needed it to improve his blood sugars to prevent complications. States he can improve his diet a little, but said he can't exercise d/t his back. Surgeon said his HgbA1C must be below 8% to have back surgery. Discussed HgbA1C of 11.1% which means average daily blood sugars of 272 mg/dL. I showed him the insulin pen and he said he'd think about it. Needs a lot of encouragement. Would not recommend going home on SGLT2 with DKA.   Follow-up on 3/4.  Thank you. Ailene Ards, RD, LDN, CDE Inpatient Diabetes Coordinator [phone]

## 2020-05-10 ENCOUNTER — Encounter (HOSPITAL_COMMUNITY): Payer: Self-pay | Admitting: Internal Medicine

## 2020-05-10 ENCOUNTER — Inpatient Hospital Stay (HOSPITAL_COMMUNITY): Payer: Medicare HMO | Admitting: Certified Registered Nurse Anesthetist

## 2020-05-10 ENCOUNTER — Inpatient Hospital Stay (HOSPITAL_COMMUNITY): Payer: Medicare HMO

## 2020-05-10 ENCOUNTER — Other Ambulatory Visit: Payer: Self-pay

## 2020-05-10 ENCOUNTER — Encounter (HOSPITAL_COMMUNITY)
Admission: EM | Disposition: A | Discharge status: Home / Self Care | Payer: Self-pay | Source: Home / Self Care | Attending: Internal Medicine

## 2020-05-10 DIAGNOSIS — K8050 Calculus of bile duct without cholangitis or cholecystitis without obstruction: Secondary | ICD-10-CM | POA: Diagnosis not present

## 2020-05-10 DIAGNOSIS — E1110 Type 2 diabetes mellitus with ketoacidosis without coma: Secondary | ICD-10-CM | POA: Diagnosis not present

## 2020-05-10 HISTORY — PX: REMOVAL OF STONES: SHX5545

## 2020-05-10 HISTORY — PX: ERCP: SHX5425

## 2020-05-10 HISTORY — PX: BILIARY DILATION: SHX6850

## 2020-05-10 HISTORY — PX: SPHINCTEROTOMY: SHX5544

## 2020-05-10 LAB — BASIC METABOLIC PANEL
Anion gap: 11 (ref 5–15)
BUN: 25 mg/dL — ABNORMAL HIGH (ref 8–23)
CO2: 27 mmol/L (ref 22–32)
Calcium: 8.9 mg/dL (ref 8.9–10.3)
Chloride: 98 mmol/L (ref 98–111)
Creatinine, Ser: 1.41 mg/dL — ABNORMAL HIGH (ref 0.61–1.24)
GFR, Estimated: 52 mL/min — ABNORMAL LOW (ref >60)
Glucose, Bld: 177 mg/dL — ABNORMAL HIGH (ref 70–99)
Potassium: 3.3 mmol/L — ABNORMAL LOW (ref 3.5–5.1)
Sodium: 136 mmol/L (ref 135–145)

## 2020-05-10 LAB — HEPATIC FUNCTION PANEL
ALT: 216 U/L — ABNORMAL HIGH (ref 0–44)
AST: 196 U/L — ABNORMAL HIGH (ref 15–41)
Albumin: 2.9 g/dL — ABNORMAL LOW (ref 3.5–5.0)
Alkaline Phosphatase: 301 U/L — ABNORMAL HIGH (ref 38–126)
Bilirubin, Direct: 3.3 mg/dL — ABNORMAL HIGH (ref 0.0–0.2)
Indirect Bilirubin: 1.8 mg/dL — ABNORMAL HIGH (ref 0.3–0.9)
Total Bilirubin: 5.1 mg/dL — ABNORMAL HIGH (ref 0.3–1.2)
Total Protein: 5.5 g/dL — ABNORMAL LOW (ref 6.5–8.1)

## 2020-05-10 LAB — GLUCOSE, CAPILLARY
Glucose-Capillary: 168 mg/dL — ABNORMAL HIGH (ref 70–99)
Glucose-Capillary: 247 mg/dL — ABNORMAL HIGH (ref 70–99)
Glucose-Capillary: 129 mg/dL — ABNORMAL HIGH (ref 70–99)
Glucose-Capillary: 204 mg/dL — ABNORMAL HIGH (ref 70–99)

## 2020-05-10 LAB — BETA-HYDROXYBUTYRIC ACID: Beta-Hydroxybutyric Acid: 0.75 mmol/L — ABNORMAL HIGH (ref 0.05–0.27)

## 2020-05-10 LAB — CBC
HCT: 32.4 % — ABNORMAL LOW (ref 39.0–52.0)
Hemoglobin: 10.8 g/dL — ABNORMAL LOW (ref 13.0–17.0)
MCH: 29.8 pg (ref 26.0–34.0)
MCHC: 33.3 g/dL (ref 30.0–36.0)
MCV: 89.5 fL (ref 80.0–100.0)
Platelets: 129 10*3/uL — ABNORMAL LOW (ref 150–400)
RBC: 3.62 MIL/uL — ABNORMAL LOW (ref 4.22–5.81)
RDW: 14.7 % (ref 11.5–15.5)
WBC: 6 10*3/uL (ref 4.0–10.5)
nRBC: 0 % (ref 0.0–0.2)

## 2020-05-10 SURGERY — ERCP, WITH INTERVENTION IF INDICATED
Anesthesia: General

## 2020-05-10 MED ORDER — PROPOFOL 10 MG/ML IV BOLUS
INTRAVENOUS | Status: AC
  Filled 2020-05-10: qty 20

## 2020-05-10 MED ORDER — PROPOFOL 10 MG/ML IV BOLUS
INTRAVENOUS | Status: DC | PRN
  Administered 2020-05-10: 120 mg via INTRAVENOUS

## 2020-05-10 MED ORDER — LIDOCAINE 2% (20 MG/ML) 5 ML SYRINGE
INTRAMUSCULAR | Status: DC | PRN
  Administered 2020-05-10: 60 mg via INTRAVENOUS

## 2020-05-10 MED ORDER — SODIUM CHLORIDE 0.9 % IV SOLN: INTRAVENOUS | Status: DC

## 2020-05-10 MED ORDER — POTASSIUM CHLORIDE CRYS ER 20 MEQ PO TBCR
40.0000 meq | EXTENDED_RELEASE_TABLET | Freq: Once | ORAL | Status: AC
  Administered 2020-05-10: 40 meq via ORAL
  Filled 2020-05-10: qty 2

## 2020-05-10 MED ORDER — PHENYLEPHRINE 40 MCG/ML (10ML) SYRINGE FOR IV PUSH (FOR BLOOD PRESSURE SUPPORT)
PREFILLED_SYRINGE | INTRAVENOUS | Status: DC | PRN
  Administered 2020-05-10 (×3): 80 ug via INTRAVENOUS

## 2020-05-10 MED ORDER — LACTATED RINGERS IV SOLN: INTRAVENOUS | Status: DC

## 2020-05-10 MED ORDER — INDOMETHACIN 50 MG RE SUPP
RECTAL | Status: DC | PRN
  Administered 2020-05-10: 100 mg via RECTAL

## 2020-05-10 MED ORDER — FENTANYL CITRATE (PF) 250 MCG/5ML IJ SOLN
INTRAMUSCULAR | Status: DC | PRN
  Administered 2020-05-10 (×2): 50 ug via INTRAVENOUS

## 2020-05-10 MED ORDER — SODIUM CHLORIDE 0.9 % IV SOLN
INTRAVENOUS | Status: DC | PRN
  Administered 2020-05-10: 40 mL

## 2020-05-10 MED ORDER — SUGAMMADEX SODIUM 200 MG/2ML IV SOLN
INTRAVENOUS | Status: DC | PRN
  Administered 2020-05-10: 200 mg via INTRAVENOUS

## 2020-05-10 MED ORDER — INDOMETHACIN 50 MG RE SUPP
RECTAL | Status: AC
  Filled 2020-05-10: qty 2

## 2020-05-10 MED ORDER — INSULIN GLARGINE 100 UNIT/ML ~~LOC~~ SOLN
10.0000 [IU] | Freq: Every day | SUBCUTANEOUS | Status: DC
  Administered 2020-05-10 – 2020-05-13 (×3): 10 [IU] via SUBCUTANEOUS
  Filled 2020-05-10 (×4): qty 0.1

## 2020-05-10 MED ORDER — GLUCAGON HCL RDNA (DIAGNOSTIC) 1 MG IJ SOLR
INTRAMUSCULAR | Status: DC | PRN
  Administered 2020-05-10: .5 mg via INTRAVENOUS

## 2020-05-10 MED ORDER — FENTANYL CITRATE (PF) 100 MCG/2ML IJ SOLN
INTRAMUSCULAR | Status: AC
  Filled 2020-05-10: qty 2

## 2020-05-10 MED ORDER — POTASSIUM CHLORIDE 10 MEQ/100ML IV SOLN
10.0000 meq | INTRAVENOUS | Status: DC
Start: 2020-05-10 — End: 2020-05-10

## 2020-05-10 MED ORDER — ROCURONIUM BROMIDE 10 MG/ML (PF) SYRINGE
PREFILLED_SYRINGE | INTRAVENOUS | Status: DC | PRN
  Administered 2020-05-10: 50 mg via INTRAVENOUS

## 2020-05-10 MED ORDER — GLUCAGON HCL RDNA (DIAGNOSTIC) 1 MG IJ SOLR
INTRAMUSCULAR | Status: AC
  Filled 2020-05-10: qty 1

## 2020-05-10 MED ORDER — INDOMETHACIN 50 MG RE SUPP: 100.0000 mg | Freq: Once | RECTAL | Status: DC

## 2020-05-10 MED ORDER — ONDANSETRON HCL 4 MG/2ML IJ SOLN
INTRAMUSCULAR | Status: DC | PRN
  Administered 2020-05-10: 4 mg via INTRAVENOUS

## 2020-05-10 MED ORDER — PHENYLEPHRINE HCL-NACL 10-0.9 MG/250ML-% IV SOLN
INTRAVENOUS | Status: DC | PRN
  Administered 2020-05-10: 50 ug/min via INTRAVENOUS

## 2020-05-10 NOTE — Op Note (Signed)
[facility] Patient Name: Gabriel Duarte Procedure Date: 05/10/2020 MRN: [id number] Attending MD: Iva Boop , MD Date of Birth: Jan 10, 1946 CSN: [id number] Age: 75 Admit Type: Outpatient Procedure:                ERCP Indications:              Bile duct stone(s) Providers:                Iva Boop, MD, Dwain Sarna, RN, Rosilyn Mings, Technician, Steffanie Dunn CRNA Referring MD:              Medicines:                General Anesthesia, Indomethacin 100 mg PR, Regular                            running Zosyn Complications:            No immediate complications. Estimated Blood Loss:     Estimated blood loss was minimal. Procedure:                Pre-Anesthesia Assessment:                           - Prior to the procedure, a History and Physical                            was performed, and patient medications and                            allergies were reviewed. The patient's tolerance of                            previous anesthesia was also reviewed. The risks                            and benefits of the procedure and the sedation                            options and risks were discussed with the patient.                            All questions were answered, and informed consent                            was obtained. Prior Anticoagulants: The patient                            last took Eliquis (apixaban) 2 days prior to the                            procedure. ASA Grade Assessment: III - A patient  with severe systemic disease. After reviewing the                            risks and benefits, the patient was deemed in                            satisfactory condition to undergo the procedure.                           After obtaining informed consent, the scope was                            passed under direct vision. Throughout the                            procedure, the patient's  blood pressure, pulse, and                            oxygen saturations were monitored continuously. The                            [employer] D single use                            duodenoscope was introduced through the mouth, and                            used to inject contrast into and used to inject                            contrast into the bile duct. The ERCP was somewhat                            difficult due to Bowel motility. Successful                            completion of the procedure was aided by Glucagon                            injection. The patient tolerated the procedure well. Scope In: Scope Out: Findings:      The scout film was normal. The Exalt duodenoscope was inserted the       esophagus was not seen well. Stomach was normal. Duodenum normal.       Papilla was seen orifice open draining some dark green bile.       Normal-sized papilla. There was a fair amount of bowel motility as the       case ensued. I was able to obtain deep cannulation of the bile duct       using wire-guided technique. The wire did go into the pancreatic duct       once. Contrast injection revealed the 9 to 10 mm stone in the distal       duct. The bile duct was about 10 mm maximum. Gallbladder filled.  Intrahepatics normal. Biliary sphincterotomy was performed motility made       this somewhat difficult but were able to get it done attempts to extract       the stone with balloon were unsuccessful I extended the sphincterotomy       and then eventually dilated with a 10 mm balloon for sphincteroplasty       and I was able to use the extraction balloon and remove a hard calcified       stone that was darkly pigmented. Scope was removed and the procedure was       tolerated well. Impression:               - Choledocholithiasis was found. Complete removal                            was accomplished by biliary sphincterotomy and                             balloon extraction. Moderate Sedation:      Not Applicable - Patient had care per Anesthesia. Recommendation:           - Return patient to ICU for ongoing care.                           - Clear liquid diet.                           - I would continue antibiotics for now we could                            probably discontinue them tomorrow. I think surgery                            has been consulted to consider cholecystectomy.                            This is appropriate once he recovers fully from his                            DKA. I have ordered labs for tomorrow. If he does                            well he can move to a solid diet tomorrow.                           do not restart Eliquis until we know about possible                            cholecystectomy and tomorrow at earliest Procedure Code(s):        --- Professional ---                           [phone], 59, Endoscopic retrograde  cholangiopancreatography (ERCP); with                            trans-endoscopic balloon dilation of                            biliary/pancreatic duct(s) or of ampulla                            (sphincteroplasty), including sphincterotomy, when                            performed, each duct                           43264, Endoscopic retrograde                            cholangiopancreatography (ERCP); with removal of                            calculi/debris from biliary/pancreatic duct(s)                           43262, 59, Endoscopic retrograde                            cholangiopancreatography (ERCP); with                            sphincterotomy/papillotomy Diagnosis Code(s):        --- Professional ---                           K80.50, Calculus of bile duct without cholangitis                            or cholecystitis without obstruction CPT copyright 2019 American Medical Association. All rights reserved. The codes documented in this report are  preliminary and upon coder review may  be revised to meet current compliance requirements. Iva Booparl E Eshan Trupiano, MD 05/10/2020 9:29:11 AM This report has been signed electronically. Number of Addenda: 0

## 2020-05-10 NOTE — Anesthesia Procedure Notes (Signed)
Procedure Name: Intubation Date/Time: 05/10/2020 7:52 AM Performed by: Mitzie Na, CRNA Pre-anesthesia Checklist: Patient identified, Emergency Drugs available, Suction available and Patient being monitored Patient Re-evaluated:Patient Re-evaluated prior to induction Oxygen Delivery Method: Circle system utilized Preoxygenation: Pre-oxygenation with 100% oxygen Induction Type: IV induction Ventilation: Mask ventilation with difficulty, Oral airway inserted - appropriate to patient size and Two handed mask ventilation required Laryngoscope Size: Mac and 3 Grade View: Grade I Tube type: Oral Tube size: 7.5 mm Number of attempts: 1 Airway Equipment and Method: Stylet and Oral airway Placement Confirmation: ETT inserted through vocal cords under direct vision,  positive ETCO2 and breath sounds checked- equal and bilateral Secured at: 24 cm Tube secured with: Tape Dental Injury: Teeth and Oropharynx as per pre-operative assessment

## 2020-05-10 NOTE — Consult Note (Addendum)
[facility] Surgery Consult Note  Gabriel Duarte 07-18-1945  [id number].    Requesting MD: Rodena Piety, MD Chief Complaint/Reason for Consult: choledocholithiasis   HPI:  [name] is a 75 y/o M with a PMH DM2, HTN, HLD, a.fib on Eliquis, and PPM placement who presented to the ED 3/2 with a cc acute onset chest pain that started earlier in the day when he was watching TV. Pt denies similar pain in the past. Associated sxs include an episode of emesis, which relieved his pain a little, and constipation. He denies fever, chills, SOB, diaphoresis, diarrhea, melena, or hematochezia.Marland Kitchen   ]At baseline he lives on his own, mobilizes with a walker, and drives a vehicle. He does not mobilize further than from his one-story home to his mailbox, going down a couple of steps to get there. Typically walks just from the couch to the bathroom. Denies exertional chest pain or SOB. States that 3 years ago he was going to the gym 5 times weekly and walking on the treadmill for 45 minutes at a time but then he developed worsening scoliosis, degenerative disk disease with nerve compression and can no longer walk long distances. He is a former smoker who quit over 30 years ago. Denies alcohol or illicit drug use. NKDA but has received prednisone tapers for "itching" in the past.  ED workup included a negative cardiac workup (high sensitivity troponins 13, 11, negative CXR, no acute EKG changes - a.fib rate controlled). He was noted to be in DKA with hyperglydemia and anion gap of 24. CT scan of the abdomen showed choledocholithiasis without cholecystitis. LFTs as below Hepatic Function Latest Ref Rng & Units 05/10/2020 05/09/2020 05/08/2020  Total Protein 6.5 - 8.1 g/dL 5.5(L) 5.8(L) 7.6  Albumin 3.5 - 5.0 g/dL 2.9(L) 3.0(L) 4.2  AST 15 - 41 U/L 196(H) 357(H) 303(H)  ALT 0 - 44 U/L 216(H) 255(H) 172(H)  Alk Phosphatase 38 - 126 U/L 301(H) 313(H) 360(H)  Total Bilirubin 0.3 - 1.2 mg/dL 5.1(H) 6.1(H) 5.9(H)   Bilirubin, Direct 0.0 - 0.2 mg/dL 3.3(H) 3.6(H) -     ROS: ROS  History reviewed. No pertinent family history.  Past Medical History:  Diagnosis Date  . Atrial fibrillation ([place])   . CKD (chronic kidney disease) stage 3, GFR 30-59 ml/min (HCC)   . Diabetes mellitus without complication ([place])   . DKA (diabetic ketoacidosis) ([place])   . High cholesterol   . Hx of adenomatous polyp of colon 06/2017  . Hypertension   . Umbilical hernia     Past Surgical History:  Procedure Laterality Date  . COLONOSCOPY  06/2017  . ESOPHAGOGASTRODUODENOSCOPY  06/2017  . PACEMAKER IMPLANT     For Tachybrady    Social History:  reports that he quit smoking about 57 years ago. He has never used smokeless tobacco. He reports that he does not drink alcohol and does not use drugs.  Allergies: No Known Allergies  Medications Prior to Admission  Medication Sig Dispense Refill  . amLODipine (NORVASC) 10 MG tablet Take 10 mg by mouth daily.    . Cholecalciferol 125 MCG (5000 UT) TABS Take 1 tablet by mouth daily.    Marland Kitchen ELIQUIS 5 MG TABS tablet Take 5 mg by mouth at bedtime.    Marland Kitchen glipiZIDE (GLUCOTROL) 10 MG tablet Take 10 mg by mouth 2 (two) times daily before a meal.    . JARDIANCE 25 MG TABS tablet Take 25 mg by mouth every morning.    . linagliptin (TRADJENTA)  5 MG TABS tablet Take 1 tablet by mouth daily.    . Omega-3 Fatty Acids (FISH OIL) 1000 MG CAPS Take 1 capsule by mouth daily.    . pravastatin (PRAVACHOL) 20 MG tablet Take 20 mg by mouth at bedtime.    . propranolol (INDERAL) 20 MG tablet Take 20 mg by mouth daily.    . vitamin B-12 (CYANOCOBALAMIN) 1000 MCG tablet Take 1,000 mcg by mouth daily.      Blood pressure (!) 146/73, pulse 74, temperature 97.9 F (36.6 C), temperature source Oral, resp. rate 17, height '5\' 7"'  (1.702 m), weight 90.9 kg, SpO2 99 %. Physical Exam: Constitutional: NAD; conversant; no deformities Eyes: Moist conjunctiva; no lid lag; scleral icterus present;  PERRL Neck: Trachea midline; no thyromegaly Lungs: Normal respiratory effort; no tactile fremitus CV: RRR; no palpable thrills; no pitting edema GI: Abd soft, obese, non-tender, large umbilical hernia that is soft and reducible but immediately recurs. No cellulitis of the abdominal wall. no palpable hepatosplenomegaly MSK: symmetrical; no clubbing/cyanosis Psychiatric: Appropriate affect; alert and oriented x3 Lymphatic: No palpable cervical or axillary lymphadenopathy  Results for orders placed or performed during the hospital encounter of 05/08/20 (from the past 48 hour(s))  CBC     Status: Abnormal   Collection Time: 05/08/20  7:08 PM  Result Value Ref Range   WBC 8.3 4.0 - 10.5 K/uL   RBC 4.72 4.22 - 5.81 MIL/uL   Hemoglobin 14.2 13.0 - 17.0 g/dL   HCT 40.9 39.0 - 52.0 %   MCV 86.7 80.0 - 100.0 fL   MCH 30.1 26.0 - 34.0 pg   MCHC 34.7 30.0 - 36.0 g/dL   RDW 14.5 11.5 - 15.5 %   Platelets 147 (L) 150 - 400 K/uL   nRBC 0.0 0.0 - 0.2 %    Comment: Performed at [facility], [address] [place], [place] [address]  Troponin I (High Sensitivity)     Status: None   Collection Time: 05/08/20  7:08 PM  Result Value Ref Range   Troponin I (High Sensitivity) 11 <18 ng/L    Comment: (NOTE) Elevated high sensitivity troponin I (hsTnI) values and significant  changes across serial measurements may suggest ACS but many other  chronic and acute conditions are known to elevate hsTnI results.  Refer to the "Links" section for chest pain algorithms and additional  guidance. Performed at [facility], [address] [place], [place] [address]   Comprehensive metabolic panel     Status: Abnormal   Collection Time: 05/08/20  7:08 PM  Result Value Ref Range   Sodium 135 135 - 145 mmol/L   Potassium 3.3 (L) 3.5 - 5.1 mmol/L   Chloride 89 (L) 98 - 111 mmol/L   CO2 22 22 - 32 mmol/L   Glucose, Bld 396 (H) 70 - 99 mg/dL    Comment: Glucose reference range applies only  to samples taken after fasting for at least 8 hours.   BUN 38 (H) 8 - 23 mg/dL   Creatinine, Ser 1.57 (H) 0.61 - 1.24 mg/dL   Calcium 10.6 (H) 8.9 - 10.3 mg/dL   Total Protein 7.6 6.5 - 8.1 g/dL   Albumin 4.2 3.5 - 5.0 g/dL   AST 303 (H) 15 - 41 U/L   ALT 172 (H) 0 - 44 U/L   Alkaline Phosphatase 360 (H) 38 - 126 U/L   Total Bilirubin 5.9 (H) 0.3 - 1.2 mg/dL   GFR, Estimated 46 (  L) >60 mL/min    Comment: (NOTE) Calculated using the CKD-EPI Creatinine Equation (2021)    Anion gap 24 (H) 5 - 15    Comment: Performed at [facility], [address] [place], [place] [address]  Lipase, blood     Status: None   Collection Time: 05/08/20  7:08 PM  Result Value Ref Range   Lipase 39 11 - 51 U/L    Comment: Performed at [facility], [address] [place], [place] [address]  Hepatitis panel, acute     Status: None   Collection Time: 05/08/20  7:08 PM  Result Value Ref Range   Hepatitis B Surface Ag NON REACTIVE NON REACTIVE   HCV Ab NON REACTIVE NON REACTIVE    Comment: (NOTE) Nonreactive HCV antibody screen is consistent with no HCV infections,  unless recent infection is suspected or other evidence exists to indicate HCV infection.     Hep A IgM NON REACTIVE NON REACTIVE   Hep B C IgM NON REACTIVE NON REACTIVE    Comment: Performed at [facility], [place] [address], [place], [place] [address]  Troponin I (High Sensitivity)     Status: None   Collection Time: 05/08/20  9:38 PM  Result Value Ref Range   Troponin I (High Sensitivity) 13 <18 ng/L    Comment: (NOTE) Elevated high sensitivity troponin I (hsTnI) values and significant  changes across serial measurements may suggest ACS but many other  chronic and acute conditions are known to elevate hsTnI results.  Refer to the "Links" section for chest pain algorithms and additional  guidance. Performed at [facility], [place] [address] [place], [place] [address]   Beta-hydroxybutyric acid      Status: Abnormal   Collection Time: 05/08/20  9:38 PM  Result Value Ref Range   Beta-Hydroxybutyric Acid 5.83 (H) 0.05 - 0.27 mmol/L    Comment: RESULTS CONFIRMED BY MANUAL DILUTION Performed at [facility] [address], [place], [place] [address]   I-Stat venous blood gas, ED     Status: Abnormal   Collection Time: 05/08/20  9:56 PM  Result Value Ref Range   pH, Ven 7.409 7.250 - 7.430   pCO2, Ven 43.3 (L) 44.0 - 60.0 mmHg   pO2, Ven 29.0 (LL) 32.0 - 45.0 mmHg   Bicarbonate 27.4 20.0 - 28.0 mmol/L   TCO2 29 22 - 32 mmol/L   O2 Saturation 55.0 %   Acid-Base Excess 2.0 0.0 - 2.0 mmol/L   Sodium 136 135 - 145 mmol/L   Potassium 3.2 (L) 3.5 - 5.1 mmol/L   Calcium, Ion 1.23 1.15 - 1.40 mmol/L   HCT 37.0 (L) 39.0 - 52.0 %   Hemoglobin 12.6 (L) 13.0 - 17.0 g/dL   Patient temperature 98.6 F    Sample type VENOUS    Comment NOTIFIED PHYSICIAN   CBG monitoring, ED     Status: Abnormal   Collection Time: 05/08/20 11:24 PM  Result Value Ref Range   Glucose-Capillary 305 (H) 70 - 99 mg/dL    Comment: Glucose reference range applies only to samples taken after fasting for at least 8 hours.  Urinalysis, Routine w reflex microscopic Nasopharyngeal Swab     Status: Abnormal   Collection Time: 05/08/20 11:45 PM  Result Value Ref Range   Color, Urine YELLOW YELLOW   APPearance CLEAR CLEAR   Specific Gravity, Urine 1.015 1.005 - 1.030   pH 5.0 5.0 -  8.0   Glucose, UA >=500 (A) NEGATIVE mg/dL   Hgb urine dipstick SMALL (A) NEGATIVE   Bilirubin Urine NEGATIVE NEGATIVE   Ketones, ur 80 (A) NEGATIVE mg/dL   Protein, ur NEGATIVE NEGATIVE mg/dL   Nitrite NEGATIVE NEGATIVE   Leukocytes,Ua NEGATIVE NEGATIVE    Comment: Performed at [facility], [address] [place], [place] [address]  Resp Panel by RT-PCR (Flu A&B, Covid) Nasopharyngeal Swab     Status: None   Collection Time: 05/08/20 11:45 PM   Specimen: Nasopharyngeal Swab; Nasopharyngeal(NP) swabs in vial transport  medium  Result Value Ref Range   SARS Coronavirus 2 by RT PCR NEGATIVE NEGATIVE    Comment: (NOTE) SARS-CoV-2 target nucleic acids are NOT DETECTED.  The SARS-CoV-2 RNA is generally detectable in upper respiratory specimens during the acute phase of infection. The lowest concentration of SARS-CoV-2 viral copies this assay can detect is 138 copies/mL. A negative result does not preclude SARS-Cov-2 infection and should not be used as the sole basis for treatment or other patient management decisions. A negative result may occur with  improper specimen collection/handling, submission of specimen other than nasopharyngeal swab, presence of viral mutation(s) within the areas targeted by this assay, and inadequate number of viral copies(<138 copies/mL). A negative result must be combined with clinical observations, patient history, and epidemiological information. The expected result is Negative.  Fact Sheet for Patients:  EntrepreneurPulse.com.au  Fact Sheet for Healthcare Providers:  IncredibleEmployment.be  This test is no t yet approved or cleared by the [place] FDA and  has been authorized for detection and/or diagnosis of SARS-CoV-2 by FDA under an Emergency Use Authorization (EUA). This EUA will remain  in effect (meaning this test can be used) for the duration of the COVID-19 declaration under Section 564(b)(1) of the Act, 21 U.S.C.section 360bbb-3(b)(1), unless the authorization is terminated  or revoked sooner.       Influenza A by PCR NEGATIVE NEGATIVE   Influenza B by PCR NEGATIVE NEGATIVE    Comment: (NOTE) The Xpert Xpress SARS-CoV-2/FLU/RSV plus assay is intended as an aid in the diagnosis of influenza from Nasopharyngeal swab specimens and should not be used as a sole basis for treatment. Nasal washings and aspirates are unacceptable for Xpert Xpress SARS-CoV-2/FLU/RSV testing.  Fact Sheet for  Patients: EntrepreneurPulse.com.au  Fact Sheet for Healthcare Providers: IncredibleEmployment.be  This test is not yet approved or cleared by the [place] FDA and has been authorized for detection and/or diagnosis of SARS-CoV-2 by FDA under an Emergency Use Authorization (EUA). This EUA will remain in effect (meaning this test can be used) for the duration of the COVID-19 declaration under Section 564(b)(1) of the Act, 21 U.S.C. section 360bbb-3(b)(1), unless the authorization is terminated or revoked.  Performed at [facility], [address] [place], [place] [address]   Protime-INR     Status: None   Collection Time: 05/08/20 11:45 PM  Result Value Ref Range   Prothrombin Time 14.5 11.4 - 15.2 seconds   INR 1.2 0.8 - 1.2    Comment: (NOTE) INR goal varies based on device and disease states. Performed at [facility], [address] [place], [place] [address]   Urinalysis, Microscopic (reflex)     Status: Abnormal   Collection Time: 05/08/20 11:45 PM  Result Value Ref Range   RBC / HPF 0-5 0 - 5 RBC/hpf   WBC, UA 0-5 0 - 5 WBC/hpf   Bacteria, UA RARE (  A) NONE SEEN   Squamous Epithelial / LPF 0-5 0 - 5    Comment: Performed at [facility], [address] [place], [place] [address]  CBG monitoring, ED     Status: Abnormal   Collection Time: 05/09/20 12:31 AM  Result Value Ref Range   Glucose-Capillary 264 (H) 70 - 99 mg/dL    Comment: Glucose reference range applies only to samples taken after fasting for at least 8 hours.   Comment 1 Notify RN   CBG monitoring, ED     Status: Abnormal   Collection Time: 05/09/20  1:42 AM  Result Value Ref Range   Glucose-Capillary 272 (H) 70 - 99 mg/dL    Comment: Glucose reference range applies only to samples taken after fasting for at least 8 hours.  MRSA PCR Screening     Status: None   Collection Time: 05/09/20  2:35 AM   Specimen: Nasopharyngeal   Result Value Ref Range   MRSA by PCR NEGATIVE NEGATIVE    Comment:        The GeneXpert MRSA Assay (FDA approved for NASAL specimens only), is one component of a comprehensive MRSA colonization surveillance program. It is not intended to diagnose MRSA infection nor to guide or monitor treatment for MRSA infections. Performed at [facility], [place] [address], [place], [place] [address]   Glucose, capillary     Status: Abnormal   Collection Time: 05/09/20  2:46 AM  Result Value Ref Range   Glucose-Capillary 201 (H) 70 - 99 mg/dL    Comment: Glucose reference range applies only to samples taken after fasting for at least 8 hours.   Comment 1 Notify RN    Comment 2 Document in Chart   Blood gas, venous     Status: Abnormal   Collection Time: 05/09/20  3:00 AM  Result Value Ref Range   pH, Ven 7.480 (H) 7.250 - 7.430   pCO2, Ven 34.0 (L) 44.0 - 60.0 mmHg   pO2, Ven 57.2 (H) 32.0 - 45.0 mmHg   Bicarbonate 25.1 20.0 - 28.0 mmol/L   Acid-Base Excess 2.3 (H) 0.0 - 2.0 mmol/L   O2 Saturation 91.6 %   Patient temperature 98.6     Comment: Performed at [facility], [place] [address], [place], [place] [address]  Basic metabolic panel     Status: Abnormal   Collection Time: 05/09/20  3:00 AM  Result Value Ref Range   Sodium 138 135 - 145 mmol/L   Potassium 3.3 (L) 3.5 - 5.1 mmol/L   Chloride 97 (L) 98 - 111 mmol/L   CO2 25 22 - 32 mmol/L   Glucose, Bld 210 (H) 70 - 99 mg/dL    Comment: Glucose reference range applies only to samples taken after fasting for at least 8 hours.   BUN 34 (H) 8 - 23 mg/dL   Creatinine, Ser 1.40 (H) 0.61 - 1.24 mg/dL   Calcium 9.5 8.9 - 10.3 mg/dL   GFR, Estimated 53 (L) >60 mL/min    Comment: (NOTE) Calculated using the CKD-EPI Creatinine Equation (2021)    Anion gap 16 (H) 5 - 15    Comment: Performed at [facility], [place] [address], [place],  [address]  Beta-hydroxybutyric acid     Status:  Abnormal   Collection Time: 05/09/20  3:00 AM  Result Value Ref Range   Beta-Hydroxybutyric Acid 3.48 (H) 0.05 - 0.27 mmol/L    Comment: Performed at [facility]  [place] [address] [place], [place] [address]  Hemoglobin A1c     Status: Abnormal   Collection Time: 05/09/20  3:00 AM  Result Value Ref Range   Hgb A1c MFr Bld 11.1 (H) 4.8 - 5.6 %    Comment: (NOTE) Pre diabetes:          5.7%-6.4%  Diabetes:              >6.4%  Glycemic control for   <7.0% adults with diabetes    Mean Plasma Glucose 271.87 mg/dL    Comment: Performed at [place] [address] [place], [place] [address]  Magnesium     Status: None   Collection Time: 05/09/20  3:00 AM  Result Value Ref Range   Magnesium 2.1 1.7 - 2.4 mg/dL    Comment: Performed at [facility], [place] [address], [place], [place] [address]  Glucose, capillary     Status: Abnormal   Collection Time: 05/09/20  3:55 AM  Result Value Ref Range   Glucose-Capillary 186 (H) 70 - 99 mg/dL    Comment: Glucose reference range applies only to samples taken after fasting for at least 8 hours.  Glucose, capillary     Status: Abnormal   Collection Time: 05/09/20  5:25 AM  Result Value Ref Range   Glucose-Capillary 199 (H) 70 - 99 mg/dL    Comment: Glucose reference range applies only to samples taken after fasting for at least 8 hours.  Glucose, capillary     Status: Abnormal   Collection Time: 05/09/20  6:39 AM  Result Value Ref Range   Glucose-Capillary 197 (H) 70 - 99 mg/dL    Comment: Glucose reference range applies only to samples taken after fasting for at least 8 hours.  Glucose, capillary     Status: Abnormal   Collection Time: 05/09/20  7:44 AM  Result Value Ref Range   Glucose-Capillary 190 (H) 70 - 99 mg/dL    Comment: Glucose reference range applies only to samples taken after fasting for at least 8 hours.  Basic metabolic panel     Status: Abnormal   Collection Time: 05/09/20  8:48  AM  Result Value Ref Range   Sodium 136 135 - 145 mmol/L   Potassium 3.7 3.5 - 5.1 mmol/L   Chloride 98 98 - 111 mmol/L   CO2 27 22 - 32 mmol/L   Glucose, Bld 205 (H) 70 - 99 mg/dL    Comment: Glucose reference range applies only to samples taken after fasting for at least 8 hours.   BUN 34 (H) 8 - 23 mg/dL   Creatinine, Ser 1.32 (H) 0.61 - 1.24 mg/dL   Calcium 9.3 8.9 - 10.3 mg/dL   GFR, Estimated 57 (L) >60 mL/min    Comment: (NOTE) Calculated using the CKD-EPI Creatinine Equation (2021)    Anion gap 11 5 - 15    Comment: Performed at [facility], [place] [address], [place], [place] [address]  Hepatic function panel     Status: Abnormal   Collection Time: 05/09/20  8:48 AM  Result Value Ref Range   Total Protein 5.8 (L) 6.5 - 8.1 g/dL   Albumin 3.0 (L) 3.5 - 5.0 g/dL   AST 357 (H) 15 - 41 U/L   ALT 255 (H) 0 - 44 U/L   Alkaline Phosphatase 313 (H) 38 - 126 U/L   Total Bilirubin 6.1 (H) 0.3 - 1.2 mg/dL   Bilirubin, Direct  3.6 (H) 0.0 - 0.2 mg/dL   Indirect Bilirubin 2.5 (H) 0.3 - 0.9 mg/dL    Comment: Performed at [facility], [place] [address] [place], [place] [address]  Magnesium     Status: None   Collection Time: 05/09/20  8:48 AM  Result Value Ref Range   Magnesium 2.4 1.7 - 2.4 mg/dL    Comment: Performed at [facility], [place] [address], [place], [place] [address]  Glucose, capillary     Status: Abnormal   Collection Time: 05/09/20  8:56 AM  Result Value Ref Range   Glucose-Capillary 176 (H) 70 - 99 mg/dL    Comment: Glucose reference range applies only to samples taken after fasting for at least 8 hours.  Glucose, capillary     Status: Abnormal   Collection Time: 05/09/20 10:03 AM  Result Value Ref Range   Glucose-Capillary 188 (H) 70 - 99 mg/dL    Comment: Glucose reference range applies only to samples taken after fasting for at least 8 hours.  Glucose, capillary     Status: Abnormal   Collection Time:  05/09/20 11:10 AM  Result Value Ref Range   Glucose-Capillary 180 (H) 70 - 99 mg/dL    Comment: Glucose reference range applies only to samples taken after fasting for at least 8 hours.  Glucose, capillary     Status: Abnormal   Collection Time: 05/09/20 12:06 PM  Result Value Ref Range   Glucose-Capillary 163 (H) 70 - 99 mg/dL    Comment: Glucose reference range applies only to samples taken after fasting for at least 8 hours.  Basic metabolic panel     Status: Abnormal   Collection Time: 05/09/20 12:23 PM  Result Value Ref Range   Sodium 138 135 - 145 mmol/L   Potassium 3.2 (L) 3.5 - 5.1 mmol/L   Chloride 98 98 - 111 mmol/L   CO2 27 22 - 32 mmol/L   Glucose, Bld 171 (H) 70 - 99 mg/dL    Comment: Glucose reference range applies only to samples taken after fasting for at least 8 hours.   BUN 30 (H) 8 - 23 mg/dL   Creatinine, Ser 1.31 (H) 0.61 - 1.24 mg/dL   Calcium 9.4 8.9 - 10.3 mg/dL   GFR, Estimated 57 (L) >60 mL/min    Comment: (NOTE) Calculated using the CKD-EPI Creatinine Equation (2021)    Anion gap 13 5 - 15    Comment: Performed at [facility], [place] [address], [place], [place] [address]  Beta-hydroxybutyric acid     Status: Abnormal   Collection Time: 05/09/20 12:23 PM  Result Value Ref Range   Beta-Hydroxybutyric Acid 0.75 (H) 0.05 - 0.27 mmol/L    Comment: Performed at [facility], [place] [address], [place], [place] [address]  Glucose, capillary     Status: Abnormal   Collection Time: 05/09/20  1:12 PM  Result Value Ref Range   Glucose-Capillary 157 (H) 70 - 99 mg/dL    Comment: Glucose reference range applies only to samples taken after fasting for at least 8 hours.  Glucose, capillary     Status: Abnormal   Collection Time: 05/09/20  3:48 PM  Result Value Ref Range   Glucose-Capillary 265 (H) 70 - 99 mg/dL    Comment: Glucose reference range applies only to samples taken after fasting for at least 8 hours.  Glucose,  capillary     Status: Abnormal   Collection Time: 05/09/20  7:35 PM  Result Value Ref Range   Glucose-Capillary 333 (H) 70 - 99 mg/dL    Comment: Glucose reference range applies only to samples taken after fasting for at least 8 hours.  Glucose, capillary     Status: Abnormal   Collection Time: 05/09/20  9:53 PM  Result Value Ref Range   Glucose-Capillary 277 (H) 70 - 99 mg/dL    Comment: Glucose reference range applies only to samples taken after fasting for at least 8 hours.  Hepatic function panel Once     Status: Abnormal   Collection Time: 05/10/20  5:09 AM  Result Value Ref Range   Total Protein 5.5 (L) 6.5 - 8.1 g/dL   Albumin 2.9 (L) 3.5 - 5.0 g/dL   AST 196 (H) 15 - 41 U/L   ALT 216 (H) 0 - 44 U/L   Alkaline Phosphatase 301 (H) 38 - 126 U/L   Total Bilirubin 5.1 (H) 0.3 - 1.2 mg/dL   Bilirubin, Direct 3.3 (H) 0.0 - 0.2 mg/dL   Indirect Bilirubin 1.8 (H) 0.3 - 0.9 mg/dL    Comment: Performed at [facility], [place] [address] [place], [place] [address]  CBC     Status: Abnormal   Collection Time: 05/10/20  5:09 AM  Result Value Ref Range   WBC 6.0 4.0 - 10.5 K/uL   RBC 3.62 (L) 4.22 - 5.81 MIL/uL   Hemoglobin 10.8 (L) 13.0 - 17.0 g/dL   HCT 32.4 (L) 39.0 - 52.0 %   MCV 89.5 80.0 - 100.0 fL   MCH 29.8 26.0 - 34.0 pg   MCHC 33.3 30.0 - 36.0 g/dL   RDW 14.7 11.5 - 15.5 %   Platelets 129 (L) 150 - 400 K/uL    Comment: Immature Platelet Fraction may be clinically indicated, consider ordering this additional test [id number]    nRBC 0.0 0.0 - 0.2 %    Comment: Performed at [facility], [place] [address], [place], [place] [address]  Basic metabolic panel Once     Status: Abnormal   Collection Time: 05/10/20  5:09 AM  Result Value Ref Range   Sodium 136 135 - 145 mmol/L   Potassium 3.3 (L) 3.5 - 5.1 mmol/L   Chloride 98 98 - 111 mmol/L   CO2 27 22 - 32 mmol/L   Glucose, Bld 177 (H) 70 - 99 mg/dL    Comment: Glucose reference range  applies only to samples taken after fasting for at least 8 hours.   BUN 25 (H) 8 - 23 mg/dL   Creatinine, Ser 1.41 (H) 0.61 - 1.24 mg/dL   Calcium 8.9 8.9 - 10.3 mg/dL   GFR, Estimated 52 (L) >60 mL/min    Comment: (NOTE) Calculated using the CKD-EPI Creatinine Equation (2021)    Anion gap 11 5 - 15    Comment: Performed at [facility], [place] [address] [place], [place] [address]  Glucose, capillary     Status: Abnormal   Collection Time: 05/10/20  7:17 AM  Result Value Ref Range   Glucose-Capillary 168 (H) 70 - 99 mg/dL    Comment: Glucose reference range applies only to samples taken after fasting for at least 8 hours.  Glucose, capillary     Status: Abnormal   Collection Time: 05/10/20 11:32 AM  Result Value Ref Range   Glucose-Capillary 247 (H) 70 - 99 mg/dL    Comment: Glucose reference range applies only to samples taken after fasting for at least 8 hours.  Comment 1 Notify RN    Comment 2 Document in Chart    CT Abdomen Pelvis Wo Contrast  Result Date: 05/08/2020 CLINICAL DATA:  Acute nonlocalized abdominal pain, constipation, transaminitis EXAM: CT ABDOMEN AND PELVIS WITHOUT CONTRAST TECHNIQUE: Multidetector CT imaging of the abdomen and pelvis was performed following the standard protocol without IV contrast. COMPARISON:  None. FINDINGS: Lower chest: The visualized lung bases are clear. Mediastinal shift to the left. Extensive multi-vessel coronary artery calcification. Mild global cardiomegaly. Metallic foreign body is seen within the right ventricular outflow tract. Mild pericardial calcification Hepatobiliary: Layering hyperdensity within the gallbladder likely represents sludge or layering gallstones. No pericholecystic inflammatory changes identified, however. The liver is unremarkable. No intra or extrahepatic biliary ductal dilation. 8 mm calcified calculus or debris is seen within the distal common duct in keeping with choledocholithiasis. Pancreas:  Unremarkable Spleen: Unremarkable Adrenals/Urinary Tract: The adrenal glands are unremarkable. The kidneys are normal in size and position. 2.4 cm exophytic simple cortical cyst arises from the lower pole of the left kidney. Immediately adjacent to this a homogeneously hyperdense lesion measuring 2.5 cm is seen most in keeping with a hyperdense renal cyst containing probable milk of calcium. The kidneys are otherwise unremarkable. Bladder unremarkable. Stomach/Bowel: Mild sigmoid diverticulosis. The stomach, small bowel, and large bowel are otherwise unremarkable. Appendix absent. No free intraperitoneal gas or fluid. Vascular/Lymphatic: Extensive aortoiliac atherosclerotic calcification. No aortic aneurysm. No pathologic adenopathy within the abdomen and pelvis. Reproductive: Prostate is unremarkable. Other: Moderate fat containing umbilical hernia. Mild soft tissue infiltration involving the subcutaneous fat surrounding the hernia sac may relate to early changes of incarceration. Musculoskeletal: Degenerative changes are seen within the lumbar spine. No lytic or blastic bone lesion. IMPRESSION: Choledocholithiasis with 8 mm calculus or calcific debris within the distal duct at the level of the ampulla. Cholelithiasis. Moderate fat containing umbilical hernia with surrounding inflammatory stranding within the subcutaneous fat of the anterior abdominal wall possibly representing early changes of incarceration. Aortic Atherosclerosis (ICD10-I70.0). Electronically Signed   By: Fidela Salisbury MD   On: 05/08/2020 23:35   DG Chest 2 View  Result Date: 05/08/2020 CLINICAL DATA:  Chest pain EXAM: CHEST - 2 VIEW COMPARISON:  Chest x-ray dated 06/30/2017. FINDINGS: Stable cardiomegaly. Lungs are clear. No pleural effusion or pneumothorax is seen. Osseous structures about the chest are unremarkable. IMPRESSION: No active cardiopulmonary disease. No evidence of pneumonia or pulmonary edema. Stable cardiomegaly.  Electronically Signed   By: Franki Cabot M.D.   On: 05/08/2020 18:26   DG ERCP BILIARY & PANCREATIC DUCTS  Result Date: 05/10/2020 CLINICAL DATA:  Choledocholithiasis. EXAM: ERCP TECHNIQUE: Multiple spot images obtained with the fluoroscopic device and submitted for interpretation post-procedure. COMPARISON:  CT of the abdomen and pelvis on 05/08/2020 FINDINGS: Imaging with a C-arm demonstrates cannulation of the common bile duct with cholangiogram demonstrating suggestion of a filling defect in the common bile duct and no evidence of significant biliary dilatation. Balloon sweep maneuver was performed as well as balloon dilatation across the level of the distal CBD and ampulla. IMPRESSION: Filling defect in the common bile duct consistent with choledocholithiasis. Balloon sweep maneuver and balloon dilatation performed of the CBD. These images were submitted for radiologic interpretation only. Please see the procedural report for the amount of contrast and the fluoroscopy time utilized. Electronically Signed   By: Aletta Edouard M.D.   On: 05/10/2020 09:26   US Abdomen Limited RUQ (LIVER/GB)  Result Date: 05/08/2020 CLINICAL DATA:  Transaminitis EXAM: ULTRASOUND ABDOMEN LIMITED RIGHT  UPPER QUADRANT COMPARISON:  None. FINDINGS: Gallbladder: Gallbladder is moderately distended, with dependent gallbladder sludge identified. No shadowing gallstones. Borderline gallbladder wall thickening measuring 3 mm. No pericholecystic fluid. Negative sonographic Murphy sign. Common bile duct: Diameter: 7 mm Liver: Diffuse increased liver echotexture most consistent with hepatic steatosis. No focal liver abnormality. Portal vein is patent on color Doppler imaging with normal direction of blood flow towards the liver. Other: Incidental 1.8 cm simple right renal cyst. IMPRESSION: 1. Gallbladder sludge, with nonspecific borderline gallbladder wall thickening. No evidence of cholelithiasis or cholecystitis. 2. Hepatic steatosis.  Electronically Signed   By: Randa Ngo M.D.   On: 05/08/2020 21:49    Assessment/Plan Diabetes Mellitus, poorly controlled - A1c 11.1 HTN HLD afib on Eliquis (held) PMH PPM placement  CKD 3 Umbilical hernia - per pt present for over 10 years, a little larger than it used to be, not symptomatic, contains fat, no bowel.  Choledocholithiasis - s/p ERCP sphincterotomy, balloon extraction 9-10 mm stone 05/10/20  Symptomatic cholelithiasis without cholecystitis  - AFVSS, WBC WNL  - CBC, CMP, lipase pending for AM - patient with multiple medical problems as above, I would recommend pre-operative cardiac risk stratification and clearance prior to further surgical planning. He has no urgent/emergent need for cholecystectomy but, if he is low risk from a cardiac perspective, would benefit from cholecystectomy while here and while Eliquis is held to prevent future cholecystitis or worsening symptomatic cholelithiasis.  - I spoke to cardiology who will assess the patient tomorrow 3/5.  - I will discuss this patients case with the surgeons who are on call this weekend/early next week.    Gabriel Alexanders, PA-C [facility] Please see Amion for pager number during day hours 7:00am-4:30pm 05/10/2020, 4:05 PM

## 2020-05-10 NOTE — Anesthesia Postprocedure Evaluation (Signed)
Anesthesia Post Note  Patient: Gabriel Duarte  Procedure(s) Performed: ENDOSCOPIC RETROGRADE CHOLANGIOPANCREATOGRAPHY (ERCP) (N/A ) SPHINCTEROTOMY REMOVAL OF STONES BILIARY DILATION     Patient location during evaluation: PACU Anesthesia Type: General Level of consciousness: awake and alert Pain management: pain level controlled Vital Signs Assessment: post-procedure vital signs reviewed and stable Respiratory status: spontaneous breathing, nonlabored ventilation, respiratory function stable and patient connected to nasal cannula oxygen Cardiovascular status: blood pressure returned to baseline and stable Postop Assessment: no apparent nausea or vomiting Anesthetic complications: no   No complications documented.  Last Vitals:  Vitals:   05/10/20 1027 05/10/20 1029  BP: (!) 146/73   Pulse: 79   Resp: 20   Temp: 36.8 C   SpO2: (!) 84% 93%    Last Pain:  Vitals:   05/10/20 1027  TempSrc: Oral  PainSc: 0-No pain                 Beryle Lathe

## 2020-05-10 NOTE — Transfer of Care (Signed)
Immediate Anesthesia Transfer of Care Note  Patient: Gabriel Duarte  Procedure(s) Performed: ENDOSCOPIC RETROGRADE CHOLANGIOPANCREATOGRAPHY (ERCP) (N/A )  Patient Location: Endoscopy Unit  Anesthesia Type:General  Level of Consciousness: awake, alert , oriented and patient cooperative  Airway & Oxygen Therapy: Patient Spontanous Breathing and Patient connected to face mask oxygen  Post-op Assessment: Report given to RN, Post -op Vital signs reviewed and stable and Patient moving all extremities  Post vital signs: Reviewed and stable  Last Vitals:  Vitals Value Taken Time  BP    Temp    Pulse    Resp    SpO2      Last Pain:  Vitals:   05/10/20 0707  TempSrc: Oral  PainSc: 0-No pain      Patients Stated Pain Goal: 0 (05/10/20 0400)  Complications: No complications documented.

## 2020-05-10 NOTE — Progress Notes (Signed)
PROGRESS NOTE    Gabriel Duarte  KYH:[id number] DOB: 14-Jul-1945 DOA: 05/08/2020 PCP: [facility]    Brief Narrative: 75 year old male with history of CKD stage III, chronic atrial fibrillation on Eliquis admitted with nausea vomiting chest pain and abdominal pain.  He was found to be in DKA treated with Endo tool now off of insulin drip.  He was also found to have bile duct stone status post ERCP biliary sphincterotomy and balloon extraction.  Assessment & Plan:   Principal Problem:   DKA (diabetic ketoacidosis) (HCC) Active Problems:   Choledocholithiasis   HTN (hypertension)   A-fib (HCC)   CKD (chronic kidney disease) stage 3, GFR 30-59 ml/min (HCC)   Pressure injury of skin   Umbilical hernia   #1 DKA type 1 diabetes-resolved.  Was treated with insulin drip and now resolved.  Increase Lantus to 10 units daily. Patient was on Glucotrol 10 mg twice a day, Jardiance 25 mg daily, Tradjenta 5 mg daily. ?  Unclear if this DKA was related to [facility] however I will not discharge him back home on Jardiance.  #2 choledocholithiasis status post ERCP biliary sphincterotomy and balloon extraction.  We will continue Zosyn and continue to hold Eliquis.  Surgery consulted.  #3 chronic atrial fibrillation on Eliquis continue to hold.  He takes propranolol at home which has been on hold noted his heart rate dropped below 50 with a pacemaker set at 50.  Nurse communicated with the rep, per rep heart rate should not drop below 30.  I will continue to hold propranolol.  #4 hypertension-was on Norvasc 10 mg daily propranolol 20 mg daily which has been on hold due to soft blood pressure.  #5 stage I sacral pressure injury present on admission  #6 hypokalemia replete and recheck labs magnesium normal.  #7 elevated LFTs improving  Pressure Injury 05/09/20 Sacrum Medial Stage 1 -  Intact skin with non-blanchable redness of a localized area usually over a bony prominence.  (Active)  05/09/20 0400  Location: Sacrum  Location Orientation: Medial  Staging: Stage 1 -  Intact skin with non-blanchable redness of a localized area usually over a bony prominence.  Wound Description (Comments):   Present on Admission: Yes              Estimated body mass index is 31.39 kg/m as calculated from the following:   Height as of this encounter: 5\' 7"  (1.702 m).   Weight as of this encounter: 90.9 kg.  DVT prophylaxis: None as patient is waiting for surgery and status post ERCP was on Eliquis at home Code Status: Full code Family Communication: None at bedside Disposition Plan:  Status is: Inpatient  Dispo: The patient is from: Home              Anticipated d/c is to: Home              Patient currently is not medically stable to d/c.   Difficult to place patient na   Consultants: GI and general surgery  Procedures: ERCP 05/10/2020 Antimicrobials Zosyn  Subjective:  Patient resting in bed in no acute distress complains of abdominal pain Objective: Vitals:   05/10/20 1027 05/10/20 1029 05/10/20 1100 05/10/20 1149  BP: (!) 146/73     Pulse: 79  74   Resp: 20  17   Temp: 98.3 F (36.8 C)   97.9 F (36.6 C)  TempSrc: Oral   Oral  SpO2: (!) 84% 93% 99%  Weight:      Height:        Intake/Output Summary (Last 24 hours) at 05/10/2020 1516 Last data filed at 05/10/2020 0915 Gross per 24 hour  Intake 2464.21 ml  Output 1225 ml  Net 1239.21 ml   Filed Weights   05/08/20 1751 05/09/20 0236  Weight: 92.1 kg 90.9 kg    Examination:  General exam: Appears calm and comfortable  Respiratory system: Clear to auscultation. Respiratory effort normal. Cardiovascular system: S1 & S2 heard, RRR. No JVD, murmurs, rubs, gallops or clicks. No pedal edema. Gastrointestinal system: Abdomen is nondistended, soft and tender. No organomegaly or masses felt. Normal bowel sounds heard. Central nervous system: Alert and oriented. No focal neurological  deficits. Extremities: Symmetric 5 x 5 power. Skin: No rashes, lesions or ulcers Psychiatry: Judgement and insight appear normal. Mood & affect appropriate.     Data Reviewed: I have personally reviewed following labs and imaging studies  CBC: Recent Labs  Lab 05/08/20 1908 05/08/20 2156 05/10/20 0509  WBC 8.3  --  6.0  HGB 14.2 12.6* 10.8*  HCT 40.9 37.0* 32.4*  MCV 86.7  --  89.5  PLT 147*  --  129*   Basic Metabolic Panel: Recent Labs  Lab 05/08/20 1908 05/08/20 2156 05/09/20 0300 05/09/20 0848 05/09/20 1223 05/10/20 0509  NA 135 136 138 136 138 136  K 3.3* 3.2* 3.3* 3.7 3.2* 3.3*  CL 89*  --  97* 98 98 98  CO2 22  --  25 27 27 27   GLUCOSE 396*  --  210* 205* 171* 177*  BUN 38*  --  34* 34* 30* 25*  CREATININE 1.57*  --  1.40* 1.32* 1.31* 1.41*  CALCIUM 10.6*  --  9.5 9.3 9.4 8.9  MG  --   --  2.1 2.4  --   --    GFR: Estimated Creatinine Clearance: 49.4 mL/min (A) (by C-G formula based on SCr of 1.41 mg/dL (H)). Liver Function Tests: Recent Labs  Lab 05/08/20 1908 05/09/20 0848 05/10/20 0509  AST 303* 357* 196*  ALT 172* 255* 216*  ALKPHOS 360* 313* 301*  BILITOT 5.9* 6.1* 5.1*  PROT 7.6 5.8* 5.5*  ALBUMIN 4.2 3.0* 2.9*   Recent Labs  Lab 05/08/20 1908  LIPASE 39   No results for input(s): AMMONIA in the last 168 hours. Coagulation Profile: Recent Labs  Lab 05/08/20 2345  INR 1.2   Cardiac Enzymes: No results for input(s): CKTOTAL, CKMB, CKMBINDEX, TROPONINI in the last 168 hours. BNP (last 3 results) No results for input(s): PROBNP in the last 8760 hours. HbA1C: Recent Labs    05/09/20 0300  HGBA1C 11.1*   CBG: Recent Labs  Lab 05/09/20 1548 05/09/20 1935 05/09/20 2153 05/10/20 0717 05/10/20 1132  GLUCAP 265* 333* 277* 168* 247*   Lipid Profile: No results for input(s): CHOL, HDL, LDLCALC, TRIG, CHOLHDL, LDLDIRECT in the last 72 hours. Thyroid Function Tests: No results for input(s): TSH, T4TOTAL, FREET4, T3FREE, THYROIDAB  in the last 72 hours. Anemia Panel: No results for input(s): VITAMINB12, FOLATE, FERRITIN, TIBC, IRON, RETICCTPCT in the last 72 hours. Sepsis Labs: No results for input(s): PROCALCITON, LATICACIDVEN in the last 168 hours.  Recent Results (from the past 240 hour(s))  Resp Panel by RT-PCR (Flu A&B, Covid) Nasopharyngeal Swab     Status: None   Collection Time: 05/08/20 11:45 PM   Specimen: Nasopharyngeal Swab; Nasopharyngeal(NP) swabs in vial transport medium  Result Value Ref Range Status   SARS Coronavirus 2  by RT PCR NEGATIVE NEGATIVE Final    Comment: (NOTE) SARS-CoV-2 target nucleic acids are NOT DETECTED.  The SARS-CoV-2 RNA is generally detectable in upper respiratory specimens during the acute phase of infection. The lowest concentration of SARS-CoV-2 viral copies this assay can detect is 138 copies/mL. A negative result does not preclude SARS-Cov-2 infection and should not be used as the sole basis for treatment or other patient management decisions. A negative result may occur with  improper specimen collection/handling, submission of specimen other than nasopharyngeal swab, presence of viral mutation(s) within the areas targeted by this assay, and inadequate number of viral copies(<138 copies/mL). A negative result must be combined with clinical observations, patient history, and epidemiological information. The expected result is Negative.  Fact Sheet for Patients:  BloggerCourse.com  Fact Sheet for Healthcare Providers:  SeriousBroker.it  This test is no t yet approved or cleared by the [place] FDA and  has been authorized for detection and/or diagnosis of SARS-CoV-2 by FDA under an Emergency Use Authorization (EUA). This EUA will remain  in effect (meaning this test can be used) for the duration of the COVID-19 declaration under Section 564(b)(1) of the Act, 21 U.S.C.section 360bbb-3(b)(1), unless the  authorization is terminated  or revoked sooner.       Influenza A by PCR NEGATIVE NEGATIVE Final   Influenza B by PCR NEGATIVE NEGATIVE Final    Comment: (NOTE) The Xpert Xpress SARS-CoV-2/FLU/RSV plus assay is intended as an aid in the diagnosis of influenza from Nasopharyngeal swab specimens and should not be used as a sole basis for treatment. Nasal washings and aspirates are unacceptable for Xpert Xpress SARS-CoV-2/FLU/RSV testing.  Fact Sheet for Patients: BloggerCourse.com  Fact Sheet for Healthcare Providers: SeriousBroker.it  This test is not yet approved or cleared by the [place] FDA and has been authorized for detection and/or diagnosis of SARS-CoV-2 by FDA under an Emergency Use Authorization (EUA). This EUA will remain in effect (meaning this test can be used) for the duration of the COVID-19 declaration under Section 564(b)(1) of the Act, 21 U.S.C. section 360bbb-3(b)(1), unless the authorization is terminated or revoked.  Performed at [facility], [address] [place], [place] [address]   MRSA PCR Screening     Status: None   Collection Time: 05/09/20  2:35 AM   Specimen: Nasopharyngeal  Result Value Ref Range Status   MRSA by PCR NEGATIVE NEGATIVE Final    Comment:        The GeneXpert MRSA Assay (FDA approved for NASAL specimens only), is one component of a comprehensive MRSA colonization surveillance program. It is not intended to diagnose MRSA infection nor to guide or monitor treatment for MRSA infections. Performed at [facility], [address] [place], [place] [address]          Radiology Studies: CT Abdomen Pelvis Wo Contrast  Result Date: 05/08/2020 CLINICAL DATA:  Acute nonlocalized abdominal pain, constipation, transaminitis EXAM: CT ABDOMEN AND PELVIS WITHOUT CONTRAST TECHNIQUE: Multidetector CT imaging of the abdomen and pelvis was performed  following the standard protocol without IV contrast. COMPARISON:  None. FINDINGS: Lower chest: The visualized lung bases are clear. Mediastinal shift to the left. Extensive multi-vessel coronary artery calcification. Mild global cardiomegaly. Metallic foreign body is seen within the right ventricular outflow tract. Mild pericardial calcification Hepatobiliary: Layering hyperdensity within the gallbladder likely represents sludge or layering gallstones. No pericholecystic inflammatory changes identified, however. The liver is unremarkable. No intra or extrahepatic biliary ductal  dilation. 8 mm calcified calculus or debris is seen within the distal common duct in keeping with choledocholithiasis. Pancreas: Unremarkable Spleen: Unremarkable Adrenals/Urinary Tract: The adrenal glands are unremarkable. The kidneys are normal in size and position. 2.4 cm exophytic simple cortical cyst arises from the lower pole of the left kidney. Immediately adjacent to this a homogeneously hyperdense lesion measuring 2.5 cm is seen most in keeping with a hyperdense renal cyst containing probable milk of calcium. The kidneys are otherwise unremarkable. Bladder unremarkable. Stomach/Bowel: Mild sigmoid diverticulosis. The stomach, small bowel, and large bowel are otherwise unremarkable. Appendix absent. No free intraperitoneal gas or fluid. Vascular/Lymphatic: Extensive aortoiliac atherosclerotic calcification. No aortic aneurysm. No pathologic adenopathy within the abdomen and pelvis. Reproductive: Prostate is unremarkable. Other: Moderate fat containing umbilical hernia. Mild soft tissue infiltration involving the subcutaneous fat surrounding the hernia sac may relate to early changes of incarceration. Musculoskeletal: Degenerative changes are seen within the lumbar spine. No lytic or blastic bone lesion. IMPRESSION: Choledocholithiasis with 8 mm calculus or calcific debris within the distal duct at the level of the ampulla.  Cholelithiasis. Moderate fat containing umbilical hernia with surrounding inflammatory stranding within the subcutaneous fat of the anterior abdominal wall possibly representing early changes of incarceration. Aortic Atherosclerosis (ICD10-I70.0). Electronically Signed   By: Helyn Numbers MD   On: 05/08/2020 23:35   DG Chest 2 View  Result Date: 05/08/2020 CLINICAL DATA:  Chest pain EXAM: CHEST - 2 VIEW COMPARISON:  Chest x-ray dated 06/30/2017. FINDINGS: Stable cardiomegaly. Lungs are clear. No pleural effusion or pneumothorax is seen. Osseous structures about the chest are unremarkable. IMPRESSION: No active cardiopulmonary disease. No evidence of pneumonia or pulmonary edema. Stable cardiomegaly. Electronically Signed   By: Bary Richard M.D.   On: 05/08/2020 18:26   DG ERCP BILIARY & PANCREATIC DUCTS  Result Date: 05/10/2020 CLINICAL DATA:  Choledocholithiasis. EXAM: ERCP TECHNIQUE: Multiple spot images obtained with the fluoroscopic device and submitted for interpretation post-procedure. COMPARISON:  CT of the abdomen and pelvis on 05/08/2020 FINDINGS: Imaging with a C-arm demonstrates cannulation of the common bile duct with cholangiogram demonstrating suggestion of a filling defect in the common bile duct and no evidence of significant biliary dilatation. Balloon sweep maneuver was performed as well as balloon dilatation across the level of the distal CBD and ampulla. IMPRESSION: Filling defect in the common bile duct consistent with choledocholithiasis. Balloon sweep maneuver and balloon dilatation performed of the CBD. These images were submitted for radiologic interpretation only. Please see the procedural report for the amount of contrast and the fluoroscopy time utilized. Electronically Signed   By: Irish Lack M.D.   On: 05/10/2020 09:26   US Abdomen Limited RUQ (LIVER/GB)  Result Date: 05/08/2020 CLINICAL DATA:  Transaminitis EXAM: ULTRASOUND ABDOMEN LIMITED RIGHT UPPER QUADRANT  COMPARISON:  None. FINDINGS: Gallbladder: Gallbladder is moderately distended, with dependent gallbladder sludge identified. No shadowing gallstones. Borderline gallbladder wall thickening measuring 3 mm. No pericholecystic fluid. Negative sonographic Murphy sign. Common bile duct: Diameter: 7 mm Liver: Diffuse increased liver echotexture most consistent with hepatic steatosis. No focal liver abnormality. Portal vein is patent on color Doppler imaging with normal direction of blood flow towards the liver. Other: Incidental 1.8 cm simple right renal cyst. IMPRESSION: 1. Gallbladder sludge, with nonspecific borderline gallbladder wall thickening. No evidence of cholelithiasis or cholecystitis. 2. Hepatic steatosis. Electronically Signed   By: Sharlet Salina M.D.   On: 05/08/2020 21:49        Scheduled Meds: . Chlorhexidine Gluconate Cloth  6 each Topical Daily  . indomethacin  100 mg Rectal Once  . insulin aspart  0-15 Units Subcutaneous TID AC & HS  . insulin aspart  2 Units Subcutaneous TID WC  . insulin glargine  7 Units Subcutaneous Daily  . mouth rinse  15 mL Mouth Rinse BID   Continuous Infusions: . sodium chloride 100 mL/hr at 05/10/20 0500  . lactated ringers    . piperacillin-tazobactam (ZOSYN)  IV Stopped (05/10/20 1103)     LOS: 1 day   Alwyn Ren, MD  05/10/2020, 3:16 PM

## 2020-05-10 NOTE — Interval H&P Note (Signed)
History and Physical Interval Note:  05/10/2020 7:33 AM  Gabriel Duarte  has presented today for surgery, with the diagnosis of Biliary obstruction, choledocholithiasis elevated LFTs.  The various methods of treatment have been discussed with the patient and family. After consideration of risks, benefits and other options for treatment, the patient has consented to  Procedure(s): ENDOSCOPIC RETROGRADE CHOLANGIOPANCREATOGRAPHY (ERCP) (N/A) as a surgical intervention.  The patient's history has been reviewed, patient examined, no change in status, stable for surgery.  I have reviewed the patient's chart and labs.  Questions were answered to the patient's satisfaction.     Stan Head

## 2020-05-10 NOTE — Progress Notes (Signed)
Inpatient Diabetes Program Recommendations  AACE/ADA: New Consensus Statement on Inpatient Glycemic Control (2015)  Target Ranges:  Prepandial:   less than 140 mg/dL      Peak postprandial:   less than 180 mg/dL (1-2 hours)      Critically ill patients:  140 - 180 mg/dL   Lab Results  Component Value Date   GLUCAP 168 (H) 05/10/2020   HGBA1C 11.1 (H) 05/09/2020    Review of Glycemic Control Results for Gabriel Duarte, Gabriel Duarte (MRN [id number]) as of 05/10/2020 10:33  Ref. Range 05/09/2020 19:35 05/09/2020 21:53 05/10/2020 07:17  Glucose-Capillary Latest Ref Range: 70 - 99 mg/dL 939 (H) 030 (H) 092 (H)    Diabetes history: DM2 Outpatient Diabetes medications: glipizide 10 mg BID, Jardiance 25 mg QAM, tradjenta 5 mg QD Current orders for Inpatient glycemic control: Lantus 7 units QD, Novolog 2 units TID, Novolog 0-15 units TID  HgbA1C - 11.1%  ERCP on 3/4 for gallstones Pt will be NPO after MN   Inpatient Diabetes Program Recommendations:     Increase Lantus to 10 units QD. Secure chat to MD.   Sherron Monday with patient again regarding diabetes. Again, stated that his PCP has mentioned it to him and he wants to readdress at a later time. Adamantly does not want to start insulin. Lives alone.  Reviewed patient's current A1c of 11.1%. Explained what a A1c is and what it measures. Also reviewed goal A1c with patient, importance of good glucose control @ home, and blood sugar goals. Reviewed patho of DM, risk for readmission, DKA, impact of healing and risk for infection, role of pancreas, vascular changes and comorbidities.  Patient has a meter at home and uses it daily. Reports FSBG >200's mg/dL. Reviewed target goals, especially FSBG and what his should be compared to how it is running. Reviewed differences between short acting vs long acting vs GLPs. Patient is not ready to move forward at this time. Encouraged, however, while inpatient to begin learning and allowing the RN to teach self injection in the  event he changes his mind.  Would not recommend going home on SGLT2 with DKA.  Patient has no further questions at this time.   Thanks, Lujean Rave, MSN, RNC-[facility] Diabetes Coordinator [phone] (8a-5p)

## 2020-05-11 DIAGNOSIS — Z950 Presence of cardiac pacemaker: Secondary | ICD-10-CM

## 2020-05-11 DIAGNOSIS — Z01810 Encounter for preprocedural cardiovascular examination: Secondary | ICD-10-CM

## 2020-05-11 DIAGNOSIS — K8050 Calculus of bile duct without cholangitis or cholecystitis without obstruction: Secondary | ICD-10-CM

## 2020-05-11 DIAGNOSIS — I10 Essential (primary) hypertension: Secondary | ICD-10-CM | POA: Diagnosis not present

## 2020-05-11 DIAGNOSIS — Z87898 Personal history of other specified conditions: Secondary | ICD-10-CM | POA: Diagnosis not present

## 2020-05-11 DIAGNOSIS — R945 Abnormal results of liver function studies: Secondary | ICD-10-CM | POA: Diagnosis not present

## 2020-05-11 DIAGNOSIS — E1110 Type 2 diabetes mellitus with ketoacidosis without coma: Secondary | ICD-10-CM | POA: Diagnosis not present

## 2020-05-11 DIAGNOSIS — I4821 Permanent atrial fibrillation: Secondary | ICD-10-CM | POA: Diagnosis not present

## 2020-05-11 LAB — CBC
HCT: 37.1 % — ABNORMAL LOW (ref 39.0–52.0)
Hemoglobin: 12 g/dL — ABNORMAL LOW (ref 13.0–17.0)
MCH: 29.6 pg (ref 26.0–34.0)
MCHC: 32.3 g/dL (ref 30.0–36.0)
MCV: 91.6 fL (ref 80.0–100.0)
Platelets: 149 10*3/uL — ABNORMAL LOW (ref 150–400)
RBC: 4.05 MIL/uL — ABNORMAL LOW (ref 4.22–5.81)
RDW: 14.6 % (ref 11.5–15.5)
WBC: 6 10*3/uL (ref 4.0–10.5)
nRBC: 0 % (ref 0.0–0.2)

## 2020-05-11 LAB — COMPREHENSIVE METABOLIC PANEL
ALT: 189 U/L — ABNORMAL HIGH (ref 0–44)
AST: 131 U/L — ABNORMAL HIGH (ref 15–41)
Albumin: 2.9 g/dL — ABNORMAL LOW (ref 3.5–5.0)
Alkaline Phosphatase: 307 U/L — ABNORMAL HIGH (ref 38–126)
Anion gap: 11 (ref 5–15)
BUN: 21 mg/dL (ref 8–23)
CO2: 27 mmol/L (ref 22–32)
Calcium: 9.2 mg/dL (ref 8.9–10.3)
Chloride: 100 mmol/L (ref 98–111)
Creatinine, Ser: 1.21 mg/dL (ref 0.61–1.24)
GFR, Estimated: >60 mL/min (ref >60)
Glucose, Bld: 125 mg/dL — ABNORMAL HIGH (ref 70–99)
Potassium: 3.9 mmol/L (ref 3.5–5.1)
Sodium: 138 mmol/L (ref 135–145)
Total Bilirubin: 5 mg/dL — ABNORMAL HIGH (ref 0.3–1.2)
Total Protein: 6 g/dL — ABNORMAL LOW (ref 6.5–8.1)

## 2020-05-11 LAB — GLUCOSE, CAPILLARY
Glucose-Capillary: 133 mg/dL — ABNORMAL HIGH (ref 70–99)
Glucose-Capillary: 169 mg/dL — ABNORMAL HIGH (ref 70–99)
Glucose-Capillary: 182 mg/dL — ABNORMAL HIGH (ref 70–99)
Glucose-Capillary: 177 mg/dL — ABNORMAL HIGH (ref 70–99)

## 2020-05-11 LAB — LIPASE, BLOOD: Lipase: 357 U/L — ABNORMAL HIGH (ref 11–51)

## 2020-05-11 NOTE — Progress Notes (Signed)
1 Day Post-Op   Subjective/Chief Complaint: Still with RUQ abdominal pain, but less   Objective: Vital signs in last 24 hours: Temp:  [97.7 F (36.5 C)-98.3 F (36.8 C)] 98.2 F (36.8 C) (03/05 0423) Pulse Rate:  [60-86] 61 (03/05 0423) Resp:  [15-22] 16 (03/05 0423) BP: (120-160)/(69-92) 126/74 (03/05 0423) SpO2:  [84 %-100 %] 92 % (03/05 0423) Last BM Date: 05/09/20  Intake/Output from previous day: 03/04 0701 - 03/05 0700 In: 940 [P.O.:240; I.V.:700] Out: -  Intake/Output this shift: No intake/output data recorded.  Exam: Awake and alert Abdomen soft, obese, some tenderness with guarding RUQ  Lab Results:  Recent Labs    05/10/20 0509 05/11/20 0340  WBC 6.0 6.0  HGB 10.8* 12.0*  HCT 32.4* 37.1*  PLT 129* 149*   BMET Recent Labs    05/10/20 0509 05/11/20 0340  NA 136 138  K 3.3* 3.9  CL 98 100  CO2 27 27  GLUCOSE 177* 125*  BUN 25* 21  CREATININE 1.41* 1.21  CALCIUM 8.9 9.2   PT/INR Recent Labs    05/08/20 2345  LABPROT 14.5  INR 1.2   ABG Recent Labs    05/08/20 2156 05/09/20 0300  HCO3 27.4 25.1    Studies/Results: DG ERCP BILIARY & PANCREATIC DUCTS  Result Date: 05/10/2020 CLINICAL DATA:  Choledocholithiasis. EXAM: ERCP TECHNIQUE: Multiple spot images obtained with the fluoroscopic device and submitted for interpretation post-procedure. COMPARISON:  CT of the abdomen and pelvis on 05/08/2020 FINDINGS: Imaging with a C-arm demonstrates cannulation of the common bile duct with cholangiogram demonstrating suggestion of a filling defect in the common bile duct and no evidence of significant biliary dilatation. Balloon sweep maneuver was performed as well as balloon dilatation across the level of the distal CBD and ampulla. IMPRESSION: Filling defect in the common bile duct consistent with choledocholithiasis. Balloon sweep maneuver and balloon dilatation performed of the CBD. These images were submitted for radiologic interpretation only. Please  see the procedural report for the amount of contrast and the fluoroscopy time utilized. Electronically Signed   By: Irish Lack M.D.   On: 05/10/2020 09:26    Anti-infectives: Anti-infectives (From admission, onward)   Start     Dose/Rate Route Frequency Ordered Stop   05/09/20 0600  piperacillin-tazobactam (ZOSYN) IVPB 3.375 g        3.375 g 12.5 mL/hr over 240 Minutes Intravenous Every 8 hours 05/09/20 0002     05/09/20 0015  piperacillin-tazobactam (ZOSYN) IVPB 3.375 g        3.375 g 100 mL/hr over 30 Minutes Intravenous  Once 05/09/20 0002 05/09/20 0110      Assessment/Plan: s/p Procedure(s): ENDOSCOPIC RETROGRADE CHOLANGIOPANCREATOGRAPHY (ERCP) (N/A) SPHINCTEROTOMY REMOVAL OF STONES BILIARY DILATION  Needs cardiac evaluation preop for eventual Lap Cholecystectomy Will allow diet today NPO after midnight in case surgery can be done tomorrow  LOS: 2 days    Abigail Miyamoto MD 05/11/2020

## 2020-05-11 NOTE — Progress Notes (Addendum)
[facility] Progress Note  Chief Complaint: Choledocholithiasis  History:  Denies abdominal pain today.  Did not get much sleep and is aggravated by noises and intrusions at night. Tolerating clear liquids without nausea vomiting or abdominal pain.  Had ERCP yesterday with sphincterotomy and stone extraction, no stent placement.  Procedure report reviewed and discussed with Dr. Leone Payor. Dr. Magnus Ivan of surgery is seen patient today and plans cholecystectomy next couple of days.  ROS: Cardiovascular: Denies chest pain Respiratory: Denies dyspnea Urinary: Denies dysuria  Objective:   Current Facility-Administered Medications:  .  0.9 %  sodium chloride infusion, , Intravenous, Continuous, Alwyn Ren, MD, Last Rate: 75 mL/hr at 05/11/20 0504, New Bag at 05/11/20 0504 .  acetaminophen (TYLENOL) tablet 650 mg, 650 mg, Oral, Q6H PRN, Iva Boop, MD, 650 mg at 05/09/20 0854 .  Chlorhexidine Gluconate Cloth 2 % PADS 6 each, 6 each, Topical, Daily, Iva Boop, MD, 6 each at 05/11/20 [phone] .  dextrose 50 % solution 0-50 mL, 0-50 mL, Intravenous, PRN, Iva Boop, MD .  indomethacin (INDOCIN) 50 MG suppository 100 mg, 100 mg, Rectal, Once, Iva Boop, MD .  insulin aspart (novoLOG) injection 0-15 Units, 0-15 Units, Subcutaneous, TID AC & HS, Iva Boop, MD, 2 Units at 05/11/20 0827 .  insulin aspart (novoLOG) injection 2 Units, 2 Units, Subcutaneous, TID WC, Iva Boop, MD, 2 Units at 05/11/20 [phone] .  insulin glargine (LANTUS) injection 10 Units, 10 Units, Subcutaneous, Daily, Alwyn Ren, MD, 10 Units at 05/11/20 0827 .  lactated ringers infusion, , Intravenous, Continuous, Iva Boop, MD, New Bag at 05/10/20 [phone] .  MEDLINE mouth rinse, 15 mL, Mouth Rinse, BID, Iva Boop, MD, 15 mL at 05/11/20 0819 .  piperacillin-tazobactam (ZOSYN) IVPB 3.375 g, 3.375 g, Intravenous, Q8H, Iva Boop, MD, Last Rate: 12.5 mL/hr at 05/11/20 0502, 3.375 g  at 05/11/20 0502  . sodium chloride 75 mL/hr at 05/11/20 0504  . lactated ringers    . piperacillin-tazobactam (ZOSYN)  IV 3.375 g (05/11/20 0502)     Vital signs in last 24 hrs: Vitals:   05/10/20 2056 05/11/20 0423  BP: 120/75 126/74  Pulse: 60 61  Resp: 16 16  Temp: 97.7 F (36.5 C) 98.2 F (36.8 C)  SpO2: 99% 92%    Intake/Output Summary (Last 24 hours) at 05/11/2020 1021 Last data filed at 05/11/2020 0900 Gross per 24 hour  Intake 480 ml  Output --  Net 480 ml     Physical Exam Sitting comfortably in chair, alert and conversational  HEENT: sclera mildly icteric, oral mucosa without lesions  Neck: supple, no thyromegaly, JVD or lymphadenopathy  Cardiac: RRR without murmurs, S1S2 heard, no peripheral edema  Pulm: clear to auscultation bilaterally, normal RR and effort noted  Abdomen: soft, no tenderness, with active bowel sounds. No guarding or palpable hepatosplenomegaly  Skin; warm and dry, + jaundice  Recent Labs:  CBC Latest Ref Rng & Units 05/11/2020 05/10/2020 05/08/2020  WBC 4.0 - 10.5 K/uL 6.0 6.0 -  Hemoglobin 13.0 - 17.0 g/dL 12.0(L) 10.8(L) 12.6(L)  Hematocrit 39.0 - 52.0 % 37.1(L) 32.4(L) 37.0(L)  Platelets 150 - 400 K/uL 149(L) 129(L) -    Recent Labs  Lab 05/08/20 2345  INR 1.2   CMP Latest Ref Rng & Units 05/11/2020 05/10/2020 05/09/2020  Glucose 70 - 99 mg/dL 782(U) 235(T) 614(E)  BUN 8 - 23 mg/dL 21 31(V) 40(G)  Creatinine 0.61 - 1.24 mg/dL 8.67 6.19(J)  1.31(H)  Sodium 135 - 145 mmol/L 138 136 138  Potassium 3.5 - 5.1 mmol/L 3.9 3.3(L) 3.2(L)  Chloride 98 - 111 mmol/L 100 98 98  CO2 22 - 32 mmol/L 27 27 27   Calcium 8.9 - 10.3 mg/dL 9.2 8.9 9.4  Total Protein 6.5 - 8.1 g/dL 6.0(L) 5.5(L) -  Total Bilirubin 0.3 - 1.2 mg/dL 5.0(H) 5.1(H) -  Alkaline Phos 38 - 126 U/L 307(H) 301(H) -  AST 15 - 41 U/L 131(H) 196(H) -  ALT 0 - 44 U/L 189(H) 216(H) -     Radiologic studies: ERCP image report reviewed  Assessment & Plan   Assessment:  Choledocholithiasis Elevated LFTs.  Not improved much today after ERCP, but not unusual just 1 day out from bile duct clearance. Cholelithiasis Long-term use anticoagulation  Plan: Continue clear liquid diet with cholecystectomy anticipated in the next day or 2.  Cardiology preop evaluation pending. Hepatic function panel tomorrow. Hold OAC pending surgery.  Can be resumed postoperatively when surgical service advises, from the standpoint of Post sphincterotomy bleeding risk.  Zosyn discontinued  Signing off, call as need arises  Total time 26 minutes  III Office: [phone]

## 2020-05-11 NOTE — Plan of Care (Signed)

## 2020-05-11 NOTE — Consult Note (Signed)
Cardiology Consultation:   Patient ID: Gabriel FusiJerry Duarte MRN: [id number]; DOB: December 23, 1945  Admit date: 05/08/2020 Date of Consult: 05/11/2020  PCP:  [facility]   [facility]  [facility]  [facility], Dr. Rudolpho Duarte Advanced Practice Provider:  No care team member to display Electrophysiologist:  [facility]  Patient Profile:   Gabriel Duarte is a 75 y.o. male with a hx of permanent atrial fibrillation, bradycardia s/p Micra leadless pacemaker, hyperlipidemia, type II diabetes, hypertension who is being seen today for the evaluation of preoperative cardiovascular evaluation at the request of Dr. Jerolyn Duarte.  History of Present Illness:   Mr. Gabriel KnifeWilliams present with chest pain, emesis. HsTnI unremarkable. Noted to be in DKA, CT showed choledocholithiasis without cholecystitis.  He has a Psychologist, educationalMicra leadless pacemaker [facility], Dr. Rudolpho Duarte. Per most recent note 08/10/2017, they have also discussed Watchman given his history of GI bleeding. He was last instructed to continue apixaban until [facility]. I cannot see that this ever occurred. Device check 11/10/2017 noted significant difference in septal wall motion and diminished VTI when paced vs. Not paced. Therefore device programmed conservatively to avoid excessive pacing. At last device check 03/13/20, he was pacing 0.8% of the time per notes.  He was seen by general surgery yesterday, who are requesting preoperative cardiovascular evaluation with plans for laparoscoping cholecystectomy in the next few days.   He tells me that up until 2-3 years ago, he had been doing well, but due to back issues he has been more limited. Denies any prior history of MI or CVA. Has never been limited in ambulation by chest pain or shortness of breath. Walks with a cane, can walk around the store slowly, walks around the house and short distances outside the house with  cane. Tries to avoid stairs but can take them slowly if needed. No syncope in 8-10 years.  He reports that he was told he does not need the Watchman procedure.  His presenting diffuse chest and abdominal pain has resolved. He reports improvement with emesis. He is hopeful that removing the gallbladder will prevent this from happening again.  Denies chest pain, shortness of breath at rest or with normal exertion. No PND, orthopnea, LE edema or unexpected weight gain. No syncope or palpitations.   Past Medical History:  Diagnosis Date  . Atrial fibrillation (HCC)   . CKD (chronic kidney disease) stage 3, GFR 30-59 ml/min (HCC)   . Diabetes mellitus without complication (HCC)   . DKA (diabetic ketoacidosis) (HCC)   . High cholesterol   . Hx of adenomatous polyp of colon 06/2017  . Hypertension   . Umbilical hernia     Past Surgical History:  Procedure Laterality Date  . COLONOSCOPY  06/2017  . ESOPHAGOGASTRODUODENOSCOPY  06/2017  . PACEMAKER IMPLANT     For Tachybrady     Home Medications:  Prior to Admission medications   Medication Sig Start Date End Date Taking? Authorizing Provider  amLODipine (NORVASC) 10 MG tablet Take 10 mg by mouth daily. 04/09/20  Yes [provider]  Cholecalciferol 125 MCG (5000 UT) TABS Take 1 tablet by mouth daily. 02/19/17  Yes [provider]  ELIQUIS 5 MG TABS tablet Take 5 mg by mouth at bedtime. 11/20/19  Yes [provider]  glipiZIDE (GLUCOTROL) 10 MG tablet Take 10 mg by mouth 2 (two) times daily before a meal. 06/12/10  Yes [provider]  JARDIANCE 25 MG TABS tablet Take 25 mg by mouth every morning. 02/21/20  Yes [provider]  linagliptin (TRADJENTA) 5 MG TABS tablet Take 1 tablet by mouth daily. 09/08/16  Yes [provider]  Omega-3 Fatty Acids (FISH OIL) 1000 MG CAPS Take 1 capsule by mouth daily.   Yes [provider]  pravastatin (PRAVACHOL) 20 MG tablet Take 20 mg by mouth at  bedtime. 04/09/20  Yes [provider]  propranolol (INDERAL) 20 MG tablet Take 20 mg by mouth daily.   Yes [provider]  vitamin B-12 (CYANOCOBALAMIN) 1000 MCG tablet Take 1,000 mcg by mouth daily.   Yes [provider]    Inpatient Medications: Scheduled Meds: . Chlorhexidine Gluconate Cloth  6 each Topical Daily  . indomethacin  100 mg Rectal Once  . insulin aspart  0-15 Units Subcutaneous TID AC & HS  . insulin aspart  2 Units Subcutaneous TID WC  . insulin glargine  10 Units Subcutaneous Daily  . mouth rinse  15 mL Mouth Rinse BID   Continuous Infusions: . sodium chloride 75 mL/hr at 05/11/20 0504  . lactated ringers     PRN Meds: acetaminophen, dextrose  Allergies:   No Known Allergies  Social History:   Social History   Socioeconomic History  . Marital status: Single    Spouse name: Not on file  . Number of children: Not on file  . Years of education: Not on file  . Highest education level: Not on file  Occupational History  . Not on file  Tobacco Use  . Smoking status: Former Smoker    Quit date: 1965    Years since quitting: 57.2  . Smokeless tobacco: Never Used  Vaping Use  . Vaping Use: Never used  Substance and Sexual Activity  . Alcohol use: Never  . Drug use: Never  . Sexual activity: Not on file  Other Topics Concern  . Not on file  Social History Narrative   Single former smoker no alcohol tobacco or drug use now   Social Determinants of Corporate investment banker Strain: Not on file  Food Insecurity: Not on file  Transportation Needs: Not on file  Physical Activity: Not on file  Stress: Not on file  Social Connections: Not on file  Intimate Partner Violence: Not on file    Has a partner of >20 years who lives separately but who is very involved in his history/care.  Family History:   History reviewed. No pertinent family history.   ROS:  Please see the history of present illness.  Constitutional: Negative  for chills, fever, night sweats, unintentional weight loss  HENT: Negative for ear pain and hearing loss.   Eyes: Negative for loss of vision and eye pain.  Respiratory: Negative for cough, sputum, wheezing.   Cardiovascular: See HPI. Gastrointestinal: Positive for abdominal pain, emesis.  Genitourinary: Negative for dysuria and hematuria.  Musculoskeletal: Negative for falls and myalgias.  Skin: Positive for chronic itching/rash, previously resolved on steroids and now feels like it is coming back Neurological: Negative for focal weakness, focal sensory changes and loss of consciousness.  Endo/Heme/Allergies: Does bruise/bleed easily.  All other ROS reviewed and negative.     Physical Exam/Data:   Vitals:   05/10/20 1100 05/10/20 1149 05/10/20 2056 05/11/20 0423  BP:   120/75 126/74  Pulse: 74  60 61  Resp: 17  16 16   Temp:  97.9 F (36.6 C) 97.7 F (36.5 C)  98.2 F (36.8 C)  TempSrc:  Oral Oral Oral  SpO2: 99%  99% 92%  Weight:      Height:        Intake/Output Summary (Last 24 hours) at 05/11/2020 1037 Last data filed at 05/11/2020 0900 Gross per 24 hour  Intake 480 ml  Output --  Net 480 ml   Last 3 Weights 05/09/2020 05/08/2020  Weight (lbs) 200 lb 6.4 oz 203 lb  Weight (kg) 90.9 kg 92.08 kg     Body mass index is 31.39 kg/m.  General:  Well nourished, well developed, in no acute distress HEENT: normal Lymph: no adenopathy Neck: no JVD Endocrine:  No thryomegaly Vascular: No carotid bruits; RA pulses 2+ bilaterally Cardiac:  normal S1, S2; irregularly irregular rhythm, no murmur appreciated Lungs:  clear to auscultation bilaterally, no wheezing, rhonchi or rales  Abd: soft, mildly tender Ext: no edema Musculoskeletal:  No deformities, moves all 4 limbs independently Skin: warm and dry  Neuro:  no focal abnormalities noted Psych:  Normal affect   EKG:  The EKG was personally reviewed and demonstrates:  Atrial fibrillation at 75 bpm Telemetry:  Telemetry was  personally reviewed and demonstrates:  Atrial fibrillation with rare pacing spike  Relevant CV Studies: Per Care Everywhere (I cannot see images) Left Atrium  Moderately dilated left atrium.  Left Ventricle  Normal left ventricular systolic function.  Ejection fraction is visually estimated at 55-60%.   Laboratory Data:  High Sensitivity Troponin:   Recent Labs  Lab 05/08/20 1908 05/08/20 2138  TROPONINIHS 11 13     Chemistry Recent Labs  Lab 05/09/20 1223 05/10/20 0509 05/11/20 0340  NA 138 136 138  K 3.2* 3.3* 3.9  CL 98 98 100  CO2 27 27 27   GLUCOSE 171* 177* 125*  BUN 30* 25* 21  CREATININE 1.31* 1.41* 1.21  CALCIUM 9.4 8.9 9.2  GFRNONAA 57* 52* >60  ANIONGAP 13 11 11     Recent Labs  Lab 05/09/20 0848 05/10/20 0509 05/11/20 0340  PROT 5.8* 5.5* 6.0*  ALBUMIN 3.0* 2.9* 2.9*  AST 357* 196* 131*  ALT 255* 216* 189*  ALKPHOS 313* 301* 307*  BILITOT 6.1* 5.1* 5.0*   Hematology Recent Labs  Lab 05/08/20 1908 05/08/20 2156 05/10/20 0509 05/11/20 0340  WBC 8.3  --  6.0 6.0  RBC 4.72  --  3.62* 4.05*  HGB 14.2 12.6* 10.8* 12.0*  HCT 40.9 37.0* 32.4* 37.1*  MCV 86.7  --  89.5 91.6  MCH 30.1  --  29.8 29.6  MCHC 34.7  --  33.3 32.3  RDW 14.5  --  14.7 14.6  PLT 147*  --  129* 149*   BNPNo results for input(s): BNP, PROBNP in the last 168 hours.  DDimer No results for input(s): DDIMER in the last 168 hours.   Radiology/Studies:  CT Abdomen Pelvis Wo Contrast  Result Date: 05/08/2020 CLINICAL DATA:  Acute nonlocalized abdominal pain, constipation, transaminitis EXAM: CT ABDOMEN AND PELVIS WITHOUT CONTRAST TECHNIQUE: Multidetector CT imaging of the abdomen and pelvis was performed following the standard protocol without IV contrast. COMPARISON:  None. FINDINGS: Lower chest: The visualized lung bases are clear. Mediastinal shift to the left. Extensive multi-vessel coronary artery calcification. Mild global cardiomegaly. Metallic foreign body is seen within  the right ventricular outflow tract. Mild pericardial calcification Hepatobiliary: Layering hyperdensity within the gallbladder likely represents sludge or layering gallstones. No pericholecystic inflammatory changes identified, however. The liver is unremarkable. No intra or extrahepatic biliary ductal dilation.  8 mm calcified calculus or debris is seen within the distal common duct in keeping with choledocholithiasis. Pancreas: Unremarkable Spleen: Unremarkable Adrenals/Urinary Tract: The adrenal glands are unremarkable. The kidneys are normal in size and position. 2.4 cm exophytic simple cortical cyst arises from the lower pole of the left kidney. Immediately adjacent to this a homogeneously hyperdense lesion measuring 2.5 cm is seen most in keeping with a hyperdense renal cyst containing probable milk of calcium. The kidneys are otherwise unremarkable. Bladder unremarkable. Stomach/Bowel: Mild sigmoid diverticulosis. The stomach, small bowel, and large bowel are otherwise unremarkable. Appendix absent. No free intraperitoneal gas or fluid. Vascular/Lymphatic: Extensive aortoiliac atherosclerotic calcification. No aortic aneurysm. No pathologic adenopathy within the abdomen and pelvis. Reproductive: Prostate is unremarkable. Other: Moderate fat containing umbilical hernia. Mild soft tissue infiltration involving the subcutaneous fat surrounding the hernia sac may relate to early changes of incarceration. Musculoskeletal: Degenerative changes are seen within the lumbar spine. No lytic or blastic bone lesion. IMPRESSION: Choledocholithiasis with 8 mm calculus or calcific debris within the distal duct at the level of the ampulla. Cholelithiasis. Moderate fat containing umbilical hernia with surrounding inflammatory stranding within the subcutaneous fat of the anterior abdominal wall possibly representing early changes of incarceration. Aortic Atherosclerosis (ICD10-I70.0). Electronically Signed   By: Helyn Numbers  MD   On: 05/08/2020 23:35   DG Chest 2 View  Result Date: 05/08/2020 CLINICAL DATA:  Chest pain EXAM: CHEST - 2 VIEW COMPARISON:  Chest x-ray dated 06/30/2017. FINDINGS: Stable cardiomegaly. Lungs are clear. No pleural effusion or pneumothorax is seen. Osseous structures about the chest are unremarkable. IMPRESSION: No active cardiopulmonary disease. No evidence of pneumonia or pulmonary edema. Stable cardiomegaly. Electronically Signed   By: Bary Richard M.D.   On: 05/08/2020 18:26   DG ERCP BILIARY & PANCREATIC DUCTS  Result Date: 05/10/2020 CLINICAL DATA:  Choledocholithiasis. EXAM: ERCP TECHNIQUE: Multiple spot images obtained with the fluoroscopic device and submitted for interpretation post-procedure. COMPARISON:  CT of the abdomen and pelvis on 05/08/2020 FINDINGS: Imaging with a C-arm demonstrates cannulation of the common bile duct with cholangiogram demonstrating suggestion of a filling defect in the common bile duct and no evidence of significant biliary dilatation. Balloon sweep maneuver was performed as well as balloon dilatation across the level of the distal CBD and ampulla. IMPRESSION: Filling defect in the common bile duct consistent with choledocholithiasis. Balloon sweep maneuver and balloon dilatation performed of the CBD. These images were submitted for radiologic interpretation only. Please see the procedural report for the amount of contrast and the fluoroscopy time utilized. Electronically Signed   By: Irish Lack M.D.   On: 05/10/2020 09:26   US Abdomen Limited RUQ (LIVER/GB)  Result Date: 05/08/2020 CLINICAL DATA:  Transaminitis EXAM: ULTRASOUND ABDOMEN LIMITED RIGHT UPPER QUADRANT COMPARISON:  None. FINDINGS: Gallbladder: Gallbladder is moderately distended, with dependent gallbladder sludge identified. No shadowing gallstones. Borderline gallbladder wall thickening measuring 3 mm. No pericholecystic fluid. Negative sonographic Murphy sign. Common bile duct: Diameter: 7 mm  Liver: Diffuse increased liver echotexture most consistent with hepatic steatosis. No focal liver abnormality. Portal vein is patent on color Doppler imaging with normal direction of blood flow towards the liver. Other: Incidental 1.8 cm simple right renal cyst. IMPRESSION: 1. Gallbladder sludge, with nonspecific borderline gallbladder wall thickening. No evidence of cholelithiasis or cholecystitis. 2. Hepatic steatosis. Electronically Signed   By: Sharlet Salina M.D.   On: 05/08/2020 21:49     Assessment and Plan:   Preoperative cardiovascular risk evaluation: Based on  available date, patient's RCRI score = 1, which carries a 6% 30-day risk of death, MI, or cardiac arrest.  He does not have known CAD, heart failure, or prior CVA. He is on insulin while inpatient and was only on oral medications as an outpatient. His only risk factor is the use of insulin at this time. His chest pain has resolved and suspect was due to his presentation of DKA and choledocholithiasis. He is s/p ERCP with stone removal and sphincterotomy. He tolerated general anesthesia without issues.   The patient is not currently having active cardiac symptoms, and they can achieve ~4 METs of activity.  According to ACC/AHA Guidelines, no further testing is needed.  Proceed with surgery at acceptable risk.  Our service is available as needed in the peri-operative period.     Permanent atrial fibrillation S/P Micra leadless pacemaker for bradycardia -follows with [facility] team -he states that he was told he does not need Watchman procedure -restart apixaban when stable from a surgical perspective -propranolol on hold during admission. OK to restart at discharge, recommend he follow up with his [facility] cardiology team to monitor this.  Hyperlipidemia -denies known ASCVD -currently on pravastatin, continue  Type II diabetes -he tells me he does not want to go home on insulin. Defer to primary team   Hypertension: -amlodipine on  hold during admission  [facility] will sign off.  Please call with questions. Medication Recommendations:  As above, no changes at discharge Other recommendations (labs, testing, etc):  none Follow up as an outpatient:  He should follow up with his [facility] cardiology team after discharge.  For questions or updates, please contact [facility] Please consult [email or web address] for contact info under   Signed, Jodelle Red, MD  05/11/2020 10:37 AM

## 2020-05-11 NOTE — Progress Notes (Signed)
PROGRESS NOTE    Gabriel Duarte  GQQ:[id number] DOB: 1945/09/28 DOA: 05/08/2020 PCP: [facility]    Brief Narrative: 75 year old male with history of CKD stage III, chronic atrial fibrillation on Eliquis admitted with nausea vomiting chest pain and abdominal pain.  He was found to be in DKA treated with Endo tool now off of insulin drip.  He was also found to have bile duct stone status post ERCP biliary sphincterotomy and balloon extraction.  Assessment & Plan:   Principal Problem:   DKA (diabetic ketoacidosis) (HCC) Active Problems:   Choledocholithiasis   HTN (hypertension)   A-fib (HCC)   CKD (chronic kidney disease) stage 3, GFR 30-59 ml/min (HCC)   Pressure injury of skin   Umbilical hernia   #1 DKA type 1 diabetes-resolved.  Was treated with insulin drip and now resolved.  Increase Lantus to 10 units daily. Patient was on Glucotrol 10 mg twice a day, Jardiance 25 mg daily, Tradjenta 5 mg daily. ?  Unclear if this DKA was related to [facility] however I will not discharge him back home on Jardiance.  #2 choledocholithiasis status post ERCP biliary sphincterotomy and balloon extraction. continue to hold Eliquis.  Surgery consulted. Appreciate card input   #3 chronic atrial fibrillation on Eliquis continue to hold.  He takes propranolol at home which has been on hold noted his heart rate dropped below 50 with a pacemaker set at 50.  Nurse communicated with the rep, per rep heart rate should not drop below 30.  I will continue to hold propranolol.  #4 hypertension-was on Norvasc 10 mg daily propranolol 20 mg daily which has been on hold due to soft blood pressure.  #5 stage I sacral pressure injury present on admission  #6 hypokalemia replete and recheck labs magnesium normal.  #7 elevated LFTs improving  Pressure Injury 05/09/20 Sacrum Medial Stage 1 -  Intact skin with non-blanchable redness of a localized area usually over a bony prominence.  (Active)  05/09/20 0400  Location: Sacrum  Location Orientation: Medial  Staging: Stage 1 -  Intact skin with non-blanchable redness of a localized area usually over a bony prominence.  Wound Description (Comments):   Present on Admission: Yes     Estimated body mass index is 31.39 kg/m as calculated from the following:   Height as of this encounter: 5\' 7"  (1.702 m).   Weight as of this encounter: 90.9 kg.  DVT prophylaxis: None as patient is waiting for surgery and status post ERCP was on Eliquis at home Code Status: Full code Family Communication: None at bedside Disposition Plan:  Status is: Inpatient  Dispo: The patient is from: Home              Anticipated d/c is to: Home              Patient currently is not medically stable to d/c.   Difficult to place patient na   Consultants: GI and general surgery  Procedures: ERCP 05/10/2020 Antimicrobials Zosyn  Subjective:  Patient resting in bed upset and angry that he is in the hospital for 3 days feels like nothing has been done denies any nausea vomiting remains on clear liquid diet and tolerating it. Objective: Vitals:   05/10/20 1149 05/10/20 2056 05/11/20 0423 05/11/20 1332  BP:  120/75 126/74 135/60  Pulse:  60 61 62  Resp:  16 16 15   Temp: 97.9 F (36.6 C) 97.7 F (36.5 C) 98.2 F (36.8 C) 98.1 F (  36.7 C)  TempSrc: Oral Oral Oral Oral  SpO2:  99% 92% 97%  Weight:      Height:        Intake/Output Summary (Last 24 hours) at 05/11/2020 1333 Last data filed at 05/11/2020 0900 Gross per 24 hour  Intake 480 ml  Output --  Net 480 ml   Filed Weights   05/08/20 1751 05/09/20 0236  Weight: 92.1 kg 90.9 kg    Examination:  General exam: Appears calm and comfortable  Respiratory system: Clear to auscultation. Respiratory effort normal. Cardiovascular system: S1 & S2 heard, RRR. No JVD, murmurs, rubs, gallops or clicks. No pedal edema. Gastrointestinal system: Abdomen is nondistended, soft and tender. No  organomegaly or masses felt. Normal bowel sounds heard. Central nervous system: Alert and oriented. No focal neurological deficits. Extremities: Symmetric 5 x 5 power. Skin: No rashes, lesions or ulcers Psychiatry: Judgement and insight appear normal. Mood & affect appropriate.     Data Reviewed: I have personally reviewed following labs and imaging studies  CBC: Recent Labs  Lab 05/08/20 1908 05/08/20 2156 05/10/20 0509 05/11/20 0340  WBC 8.3  --  6.0 6.0  HGB 14.2 12.6* 10.8* 12.0*  HCT 40.9 37.0* 32.4* 37.1*  MCV 86.7  --  89.5 91.6  PLT 147*  --  129* 149*   Basic Metabolic Panel: Recent Labs  Lab 05/09/20 0300 05/09/20 0848 05/09/20 1223 05/10/20 0509 05/11/20 0340  NA 138 136 138 136 138  K 3.3* 3.7 3.2* 3.3* 3.9  CL 97* 98 98 98 100  CO2 25 27 27 27 27   GLUCOSE 210* 205* 171* 177* 125*  BUN 34* 34* 30* 25* 21  CREATININE 1.40* 1.32* 1.31* 1.41* 1.21  CALCIUM 9.5 9.3 9.4 8.9 9.2  MG 2.1 2.4  --   --   --    GFR: Estimated Creatinine Clearance: 57.6 mL/min (by C-G formula based on SCr of 1.21 mg/dL). Liver Function Tests: Recent Labs  Lab 05/08/20 1908 05/09/20 0848 05/10/20 0509 05/11/20 0340  AST 303* 357* 196* 131*  ALT 172* 255* 216* 189*  ALKPHOS 360* 313* 301* 307*  BILITOT 5.9* 6.1* 5.1* 5.0*  PROT 7.6 5.8* 5.5* 6.0*  ALBUMIN 4.2 3.0* 2.9* 2.9*   Recent Labs  Lab 05/08/20 1908 05/11/20 0340  LIPASE 39 357*   No results for input(s): AMMONIA in the last 168 hours. Coagulation Profile: Recent Labs  Lab 05/08/20 2345  INR 1.2   Cardiac Enzymes: No results for input(s): CKTOTAL, CKMB, CKMBINDEX, TROPONINI in the last 168 hours. BNP (last 3 results) No results for input(s): PROBNP in the last 8760 hours. HbA1C: Recent Labs    05/09/20 0300  HGBA1C 11.1*   CBG: Recent Labs  Lab 05/10/20 1132 05/10/20 1715 05/10/20 2058 05/11/20 0815 05/11/20 1140  GLUCAP 247* 129* 204* 133* 169*   Lipid Profile: No results for input(s):  CHOL, HDL, LDLCALC, TRIG, CHOLHDL, LDLDIRECT in the last 72 hours. Thyroid Function Tests: No results for input(s): TSH, T4TOTAL, FREET4, T3FREE, THYROIDAB in the last 72 hours. Anemia Panel: No results for input(s): VITAMINB12, FOLATE, FERRITIN, TIBC, IRON, RETICCTPCT in the last 72 hours. Sepsis Labs: No results for input(s): PROCALCITON, LATICACIDVEN in the last 168 hours.  Recent Results (from the past 240 hour(s))  Resp Panel by RT-PCR (Flu A&B, Covid) Nasopharyngeal Swab     Status: None   Collection Time: 05/08/20 11:45 PM   Specimen: Nasopharyngeal Swab; Nasopharyngeal(NP) swabs in vial transport medium  Result Value Ref Range Status  SARS Coronavirus 2 by RT PCR NEGATIVE NEGATIVE Final    Comment: (NOTE) SARS-CoV-2 target nucleic acids are NOT DETECTED.  The SARS-CoV-2 RNA is generally detectable in upper respiratory specimens during the acute phase of infection. The lowest concentration of SARS-CoV-2 viral copies this assay can detect is 138 copies/mL. A negative result does not preclude SARS-Cov-2 infection and should not be used as the sole basis for treatment or other patient management decisions. A negative result may occur with  improper specimen collection/handling, submission of specimen other than nasopharyngeal swab, presence of viral mutation(s) within the areas targeted by this assay, and inadequate number of viral copies(<138 copies/mL). A negative result must be combined with clinical observations, patient history, and epidemiological information. The expected result is Negative.  Fact Sheet for Patients:  BloggerCourse.com  Fact Sheet for Healthcare Providers:  SeriousBroker.it  This test is no t yet approved or cleared by the [place] FDA and  has been authorized for detection and/or diagnosis of SARS-CoV-2 by FDA under an Emergency Use Authorization (EUA). This EUA will remain  in effect (meaning  this test can be used) for the duration of the COVID-19 declaration under Section 564(b)(1) of the Act, 21 U.S.C.section 360bbb-3(b)(1), unless the authorization is terminated  or revoked sooner.       Influenza A by PCR NEGATIVE NEGATIVE Final   Influenza B by PCR NEGATIVE NEGATIVE Final    Comment: (NOTE) The Xpert Xpress SARS-CoV-2/FLU/RSV plus assay is intended as an aid in the diagnosis of influenza from Nasopharyngeal swab specimens and should not be used as a sole basis for treatment. Nasal washings and aspirates are unacceptable for Xpert Xpress SARS-CoV-2/FLU/RSV testing.  Fact Sheet for Patients: BloggerCourse.com  Fact Sheet for Healthcare Providers: SeriousBroker.it  This test is not yet approved or cleared by the [place] FDA and has been authorized for detection and/or diagnosis of SARS-CoV-2 by FDA under an Emergency Use Authorization (EUA). This EUA will remain in effect (meaning this test can be used) for the duration of the COVID-19 declaration under Section 564(b)(1) of the Act, 21 U.S.C. section 360bbb-3(b)(1), unless the authorization is terminated or revoked.  Performed at [facility], [address] [place], [place] [address]   MRSA PCR Screening     Status: None   Collection Time: 05/09/20  2:35 AM   Specimen: Nasopharyngeal  Result Value Ref Range Status   MRSA by PCR NEGATIVE NEGATIVE Final    Comment:        The GeneXpert MRSA Assay (FDA approved for NASAL specimens only), is one component of a comprehensive MRSA colonization surveillance program. It is not intended to diagnose MRSA infection nor to guide or monitor treatment for MRSA infections. Performed at [facility], [address], [place], [place] [address]          Radiology Studies: DG ERCP BILIARY & PANCREATIC DUCTS  Result Date: 05/10/2020 CLINICAL DATA:  Choledocholithiasis. EXAM: ERCP  TECHNIQUE: Multiple spot images obtained with the fluoroscopic device and submitted for interpretation post-procedure. COMPARISON:  CT of the abdomen and pelvis on 05/08/2020 FINDINGS: Imaging with a C-arm demonstrates cannulation of the common bile duct with cholangiogram demonstrating suggestion of a filling defect in the common bile duct and no evidence of significant biliary dilatation. Balloon sweep maneuver was performed as well as balloon dilatation across the level of the distal CBD and ampulla. IMPRESSION: Filling defect in the common bile duct consistent with choledocholithiasis. Balloon sweep maneuver and balloon  dilatation performed of the CBD. These images were submitted for radiologic interpretation only. Please see the procedural report for the amount of contrast and the fluoroscopy time utilized. Electronically Signed   By: Irish Lack M.D.   On: 05/10/2020 09:26        Scheduled Meds: . Chlorhexidine Gluconate Cloth  6 each Topical Daily  . indomethacin  100 mg Rectal Once  . insulin aspart  0-15 Units Subcutaneous TID AC & HS  . insulin aspart  2 Units Subcutaneous TID WC  . insulin glargine  10 Units Subcutaneous Daily  . mouth rinse  15 mL Mouth Rinse BID   Continuous Infusions: . sodium chloride 75 mL/hr at 05/11/20 0504  . lactated ringers       LOS: 2 days   Alwyn Ren, MD  05/11/2020, 1:33 PM

## 2020-05-12 ENCOUNTER — Inpatient Hospital Stay (HOSPITAL_COMMUNITY): Payer: Medicare HMO | Admitting: Anesthesiology

## 2020-05-12 ENCOUNTER — Encounter (HOSPITAL_COMMUNITY)
Admission: EM | Disposition: A | Discharge status: Home / Self Care | Payer: Self-pay | Source: Home / Self Care | Attending: Internal Medicine

## 2020-05-12 DIAGNOSIS — E1110 Type 2 diabetes mellitus with ketoacidosis without coma: Secondary | ICD-10-CM | POA: Diagnosis not present

## 2020-05-12 HISTORY — PX: CHOLECYSTECTOMY: SHX55

## 2020-05-12 LAB — CBC
HCT: 33.3 % — ABNORMAL LOW (ref 39.0–52.0)
Hemoglobin: 10.7 g/dL — ABNORMAL LOW (ref 13.0–17.0)
MCH: 29.3 pg (ref 26.0–34.0)
MCHC: 32.1 g/dL (ref 30.0–36.0)
MCV: 91.2 fL (ref 80.0–100.0)
Platelets: 135 10*3/uL — ABNORMAL LOW (ref 150–400)
RBC: 3.65 MIL/uL — ABNORMAL LOW (ref 4.22–5.81)
RDW: 14.6 % (ref 11.5–15.5)
WBC: 4.7 10*3/uL (ref 4.0–10.5)
nRBC: 0 % (ref 0.0–0.2)

## 2020-05-12 LAB — COMPREHENSIVE METABOLIC PANEL
ALT: 147 U/L — ABNORMAL HIGH (ref 0–44)
AST: 99 U/L — ABNORMAL HIGH (ref 15–41)
Albumin: 2.6 g/dL — ABNORMAL LOW (ref 3.5–5.0)
Alkaline Phosphatase: 407 U/L — ABNORMAL HIGH (ref 38–126)
Anion gap: 12 (ref 5–15)
BUN: 14 mg/dL (ref 8–23)
CO2: 21 mmol/L — ABNORMAL LOW (ref 22–32)
Calcium: 8.5 mg/dL — ABNORMAL LOW (ref 8.9–10.3)
Chloride: 103 mmol/L (ref 98–111)
Creatinine, Ser: 0.9 mg/dL (ref 0.61–1.24)
GFR, Estimated: >60 mL/min (ref >60)
Glucose, Bld: 135 mg/dL — ABNORMAL HIGH (ref 70–99)
Potassium: 3.8 mmol/L (ref 3.5–5.1)
Sodium: 136 mmol/L (ref 135–145)
Total Bilirubin: 4.3 mg/dL — ABNORMAL HIGH (ref 0.3–1.2)
Total Protein: 5.5 g/dL — ABNORMAL LOW (ref 6.5–8.1)

## 2020-05-12 LAB — GLUCOSE, CAPILLARY
Glucose-Capillary: 129 mg/dL — ABNORMAL HIGH (ref 70–99)
Glucose-Capillary: 159 mg/dL — ABNORMAL HIGH (ref 70–99)
Glucose-Capillary: 185 mg/dL — ABNORMAL HIGH (ref 70–99)
Glucose-Capillary: 215 mg/dL — ABNORMAL HIGH (ref 70–99)
Glucose-Capillary: 200 mg/dL — ABNORMAL HIGH (ref 70–99)

## 2020-05-12 LAB — LIPASE, BLOOD: Lipase: 34 U/L (ref 11–51)

## 2020-05-12 SURGERY — LAPAROSCOPIC CHOLECYSTECTOMY WITH INTRAOPERATIVE CHOLANGIOGRAM
Anesthesia: General | Site: Abdomen

## 2020-05-12 MED ORDER — ONDANSETRON HCL 4 MG/2ML IJ SOLN
INTRAMUSCULAR | Status: DC | PRN
  Administered 2020-05-12: 4 mg via INTRAVENOUS

## 2020-05-12 MED ORDER — PHENYLEPHRINE 40 MCG/ML (10ML) SYRINGE FOR IV PUSH (FOR BLOOD PRESSURE SUPPORT)
PREFILLED_SYRINGE | INTRAVENOUS | Status: AC
  Filled 2020-05-12: qty 20

## 2020-05-12 MED ORDER — ROCURONIUM BROMIDE 10 MG/ML (PF) SYRINGE
PREFILLED_SYRINGE | INTRAVENOUS | Status: DC | PRN
  Administered 2020-05-12: 50 mg via INTRAVENOUS

## 2020-05-12 MED ORDER — ONDANSETRON HCL 4 MG/2ML IJ SOLN
INTRAMUSCULAR | Status: AC
  Filled 2020-05-12: qty 2

## 2020-05-12 MED ORDER — ONDANSETRON HCL 4 MG/2ML IJ SOLN: 4.0000 mg | Freq: Once | INTRAMUSCULAR | Status: DC | PRN

## 2020-05-12 MED ORDER — FENTANYL CITRATE (PF) 250 MCG/5ML IJ SOLN
INTRAMUSCULAR | Status: DC | PRN
  Administered 2020-05-12 (×2): 50 ug via INTRAVENOUS

## 2020-05-12 MED ORDER — DEXAMETHASONE SODIUM PHOSPHATE 10 MG/ML IJ SOLN
INTRAMUSCULAR | Status: DC | PRN
  Administered 2020-05-12: 10 mg via INTRAVENOUS

## 2020-05-12 MED ORDER — SUGAMMADEX SODIUM 200 MG/2ML IV SOLN
INTRAVENOUS | Status: DC | PRN
  Administered 2020-05-12: 400 mg via INTRAVENOUS

## 2020-05-12 MED ORDER — CEFAZOLIN SODIUM-DEXTROSE 2-4 GM/100ML-% IV SOLN
2.0000 g | INTRAVENOUS | Status: AC
  Administered 2020-05-12: 2 g via INTRAVENOUS

## 2020-05-12 MED ORDER — SUCCINYLCHOLINE CHLORIDE 200 MG/10ML IV SOSY
PREFILLED_SYRINGE | INTRAVENOUS | Status: AC
  Filled 2020-05-12: qty 10

## 2020-05-12 MED ORDER — OXYCODONE HCL 5 MG PO TABS
5.0000 mg | ORAL_TABLET | Freq: Once | ORAL | Status: DC | PRN
Start: 2020-05-12 — End: 2020-05-12

## 2020-05-12 MED ORDER — EPHEDRINE 5 MG/ML INJ
INTRAVENOUS | Status: AC
  Filled 2020-05-12: qty 10

## 2020-05-12 MED ORDER — PROPOFOL 10 MG/ML IV BOLUS
INTRAVENOUS | Status: AC
  Filled 2020-05-12: qty 20

## 2020-05-12 MED ORDER — FENTANYL CITRATE (PF) 100 MCG/2ML IJ SOLN
25.0000 ug | INTRAMUSCULAR | Status: DC | PRN
  Administered 2020-05-12: 50 ug via INTRAVENOUS

## 2020-05-12 MED ORDER — DEXAMETHASONE SODIUM PHOSPHATE 10 MG/ML IJ SOLN
INTRAMUSCULAR | Status: AC
  Filled 2020-05-12: qty 1

## 2020-05-12 MED ORDER — FENTANYL CITRATE (PF) 100 MCG/2ML IJ SOLN
INTRAMUSCULAR | Status: AC
  Filled 2020-05-12: qty 2

## 2020-05-12 MED ORDER — OXYCODONE HCL 5 MG/5ML PO SOLN: 5.0000 mg | Freq: Once | ORAL | Status: DC | PRN

## 2020-05-12 MED ORDER — PROPOFOL 10 MG/ML IV BOLUS
INTRAVENOUS | Status: DC | PRN
  Administered 2020-05-12: 110 mg via INTRAVENOUS

## 2020-05-12 MED ORDER — LIDOCAINE 2% (20 MG/ML) 5 ML SYRINGE
INTRAMUSCULAR | Status: AC
  Filled 2020-05-12: qty 5

## 2020-05-12 MED ORDER — BUPIVACAINE-EPINEPHRINE (PF) 0.25% -1:200000 IJ SOLN
INTRAMUSCULAR | Status: AC
  Filled 2020-05-12: qty 30

## 2020-05-12 MED ORDER — 0.9 % SODIUM CHLORIDE (POUR BTL) OPTIME
TOPICAL | Status: DC | PRN
  Administered 2020-05-12: 1000 mL

## 2020-05-12 MED ORDER — LACTATED RINGERS IV SOLN
INTRAVENOUS | Status: AC | PRN
  Administered 2020-05-12: 1000 mL

## 2020-05-12 MED ORDER — LIDOCAINE 2% (20 MG/ML) 5 ML SYRINGE
INTRAMUSCULAR | Status: DC | PRN
  Administered 2020-05-12: 60 mg via INTRAVENOUS

## 2020-05-12 MED ORDER — ROCURONIUM BROMIDE 10 MG/ML (PF) SYRINGE
PREFILLED_SYRINGE | INTRAVENOUS | Status: AC
  Filled 2020-05-12: qty 10

## 2020-05-12 MED ORDER — BUPIVACAINE-EPINEPHRINE (PF) 0.25% -1:200000 IJ SOLN
INTRAMUSCULAR | Status: DC | PRN
  Administered 2020-05-12: 17 mL

## 2020-05-12 MED ORDER — CEFAZOLIN SODIUM-DEXTROSE 2-4 GM/100ML-% IV SOLN
INTRAVENOUS | Status: AC
  Filled 2020-05-12: qty 100

## 2020-05-12 MED ORDER — CEFAZOLIN SODIUM-DEXTROSE 2-4 GM/100ML-% IV SOLN: 2.0000 g | Freq: Three times a day (TID) | INTRAVENOUS | Status: DC

## 2020-05-12 SURGICAL SUPPLY — 35 items
APPLIER CLIP 5 13 M/L LIGAMAX5 (MISCELLANEOUS)
CABLE HIGH FREQUENCY MONO STRZ (ELECTRODE) ×2 IMPLANT
CHLORAPREP W/TINT 26 (MISCELLANEOUS) ×2 IMPLANT
CLIP APPLIE 5 13 M/L LIGAMAX5 (MISCELLANEOUS) IMPLANT
CLIP VESOLOCK MED LG 6/CT (CLIP) ×2 IMPLANT
COVER MAYO STAND STRL (DRAPES) IMPLANT
COVER TRANSDUCER ULTRASND (DRAPES) IMPLANT
COVER WAND RF STERILE (DRAPES) IMPLANT
DECANTER SPIKE VIAL GLASS SM (MISCELLANEOUS) ×2 IMPLANT
DERMABOND ADVANCED (GAUZE/BANDAGES/DRESSINGS) ×1
DERMABOND ADVANCED .7 DNX12 (GAUZE/BANDAGES/DRESSINGS) ×1 IMPLANT
DEVICE TROCAR PUNCTURE CLOSURE (ENDOMECHANICALS) ×2 IMPLANT
DRAPE C-ARM 42X120 X-RAY (DRAPES) IMPLANT
ELECT REM PT RETURN 15FT ADLT (MISCELLANEOUS) ×2 IMPLANT
GAUZE SPONGE 2X2 8PLY STRL LF (GAUZE/BANDAGES/DRESSINGS) ×1 IMPLANT
GLOVE SURG ENC MOIS LTX SZ7.5 (GLOVE) ×2 IMPLANT
GOWN STRL REUS W/TWL XL LVL3 (GOWN DISPOSABLE) ×6 IMPLANT
GRASPER SUT TROCAR 14GX15 (MISCELLANEOUS) IMPLANT
KIT BASIN OR (CUSTOM PROCEDURE TRAY) ×2 IMPLANT
KIT TURNOVER KIT A (KITS) ×2 IMPLANT
NEEDLE INSUFFLATION 14GA 120MM (NEEDLE) ×2 IMPLANT
PENCIL SMOKE EVACUATOR (MISCELLANEOUS) IMPLANT
POUCH RETRIEVAL ECOSAC 10 (ENDOMECHANICALS) ×1 IMPLANT
POUCH RETRIEVAL ECOSAC 10MM (ENDOMECHANICALS) ×1
SCISSORS LAP 5X35 DISP (ENDOMECHANICALS) ×2 IMPLANT
SET CHOLANGIOGRAPH MIX (MISCELLANEOUS) IMPLANT
SET IRRIG TUBING LAPAROSCOPIC (IRRIGATION / IRRIGATOR) ×2 IMPLANT
SET TUBE SMOKE EVAC HIGH FLOW (TUBING) ×2 IMPLANT
SLEEVE XCEL OPT CAN 5 100 (ENDOMECHANICALS) ×2 IMPLANT
SPONGE GAUZE 2X2 STER 10/PKG (GAUZE/BANDAGES/DRESSINGS) ×1
SUT MNCRL AB 4-0 PS2 18 (SUTURE) ×2 IMPLANT
TOWEL OR 17X26 10 PK STRL BLUE (TOWEL DISPOSABLE) ×2 IMPLANT
TRAY LAPAROSCOPIC (CUSTOM PROCEDURE TRAY) ×2 IMPLANT
TROCAR BLADELESS OPT 5 100 (ENDOMECHANICALS) ×2 IMPLANT
TROCAR XCEL NON-BLD 11X100MML (ENDOMECHANICALS) ×2 IMPLANT

## 2020-05-12 NOTE — Op Note (Signed)
05/12/2020  10:26 AM  PATIENT:  Gabriel Duarte  75 y.o. male  PRE-OPERATIVE DIAGNOSIS:  gallstones  POST-OPERATIVE DIAGNOSIS:  gallstones  PROCEDURE:  Procedure(s): LAPAROSCOPIC CHOLECYSTECTOMY  primary umbilical hernia repair (N/A)  SURGEON:  Surgeon(s) and Role:    Axel Filler, MD - Primary  ANESTHESIA:   local and general  EBL:  minimal   BLOOD ADMINISTERED:none  DRAINS: none   LOCAL MEDICATIONS USED:  BUPIVICAINE   SPECIMEN:  Source of Specimen:  gallbladder  DISPOSITION OF SPECIMEN:  PATHOLOGY  COUNTS:  YES  TOURNIQUET:  * No tourniquets in log *  DICTATION: .Dragon Dictation  The patient was taken to the operating and placed in the supine position with bilateral SCDs in place.  The patient was prepped and draped in the usual sterile fashion. A time out was called and all facts were verified. A pneumoperitoneum was obtained via A Veress needle technique to a pressure of 34mm of mercury.  A 32mm trochar was then placed in the right upper quadrant under visualization, and there were no injuries to any abdominal organs. A 11 mm port was then placed in the umbilical region after infiltrating with local anesthesia under direct visualization. A second and third epigastric port and right lower quadrant port placement under direct visualization, respectively.    The gallbladder was identified and retracted, the peritoneum was then sharply dissected from the gallbladder and this dissection was carried down to Calot's triangle. The cystic duct was identified and stripped away circumferentially and seen going into the gallbladder 360, the critical angle was obtained.  2 clips were placed proximally one distally and the cystic duct transected. The cystic artery was identified and 2 clips placed proximally and one distally and transected.  We then proceeded to remove the gallbladder off the hepatic fossa with Bovie cautery. A retrieval bag was then placed in the abdomen and  gallbladder placed in the bag. The hepatic fossa was then reexamined and hemostasis was achieved with Bovie cautery and was excellent at the end of the case.   The subhepatic fossa and perihepatic fossa was then irrigated until the effluent was clear.  The gallbladder and bag were removed from the abdominal cavity. The 11 mm trocar fascia was reapproximated with the Endo Close #1 Vicryl x2.   There was an incarcerated umbilical hernia seen with omentum.  The entire omentum was reduced.  The hernia was primarily repaired with interrupted 0 novalfils.   The pneumoperitoneum was evacuated and all trochars removed under direct visulalization.  The skin was then closed with 4-0 Monocryl and the skin dressed with Dermabond.    The patient was awaken from general anesthesia and taken to the recovery room in stable condition.   PLAN OF CARE: Admit for overnight observation  PATIENT DISPOSITION:  PACU - hemodynamically stable.   Delay start of Pharmacological VTE agent (>24hrs) due to surgical blood loss or risk of bleeding: not applicable

## 2020-05-12 NOTE — Anesthesia Procedure Notes (Signed)
Procedure Name: Intubation Performed by: Kynlea Blackston A, CRNA Pre-anesthesia Checklist: Patient identified, Emergency Drugs available, Suction available and Patient being monitored Patient Re-evaluated:Patient Re-evaluated prior to induction Oxygen Delivery Method: Circle system utilized Preoxygenation: Pre-oxygenation with 100% oxygen Induction Type: IV induction Ventilation: Mask ventilation without difficulty and Oral airway inserted - appropriate to patient size Laryngoscope Size: Miller and 2 Grade View: Grade I Tube type: Oral Number of attempts: 1 Airway Equipment and Method: Stylet and Oral airway Placement Confirmation: ETT inserted through vocal cords under direct vision,  positive ETCO2 and breath sounds checked- equal and bilateral Secured at: 22 cm Tube secured with: Tape Dental Injury: Teeth and Oropharynx as per pre-operative assessment        

## 2020-05-12 NOTE — Transfer of Care (Signed)
Immediate Anesthesia Transfer of Care Note  Patient: Gabriel Duarte  Procedure(s) Performed: LAPAROSCOPIC CHOLECYSTECTOMY primary umbilical hernia repair (N/A Abdomen)  Patient Location: PACU  Anesthesia Type:General  Level of Consciousness: awake, alert  and oriented  Airway & Oxygen Therapy: Patient Spontanous Breathing and Patient connected to face mask  Post-op Assessment: Report given to RN and Post -op Vital signs reviewed and stable  Post vital signs: Reviewed and stable  Last Vitals:  Vitals Value Taken Time  BP    Temp    Pulse 76 05/12/20 1040  Resp 26 05/12/20 1040  SpO2 92 % 05/12/20 1040  Vitals shown include unvalidated device data.  Last Pain:  Vitals:   05/12/20 0524  TempSrc: Oral  PainSc:       Patients Stated Pain Goal: 0 (05/10/20 0400)  Complications: No complications documented.

## 2020-05-12 NOTE — Anesthesia Postprocedure Evaluation (Signed)
Anesthesia Post Note  Patient: Gabriel Duarte  Procedure(s) Performed: LAPAROSCOPIC CHOLECYSTECTOMY primary umbilical hernia repair (N/A Abdomen)     Patient location during evaluation: PACU Anesthesia Type: General Level of consciousness: awake and alert Pain management: pain level controlled Vital Signs Assessment: post-procedure vital signs reviewed and stable Respiratory status: spontaneous breathing, nonlabored ventilation and respiratory function stable Cardiovascular status: blood pressure returned to baseline and stable Postop Assessment: no apparent nausea or vomiting Anesthetic complications: no   No complications documented.  Last Vitals:  Vitals:   05/12/20 1115 05/12/20 1215  BP: 131/65 134/71  Pulse: 79 72  Resp: (!) 22 16  Temp: 36.8 C 36.9 C  SpO2: 94% 95%    Last Pain:  Vitals:   05/12/20 1215  TempSrc: Oral  PainSc:                  Beryle Lathe

## 2020-05-12 NOTE — Progress Notes (Signed)
RN has had issues with this patient throughout the day stating he did not want the same NT for night shift, he also felt that the staff was not taking good care of him. RN entered the room during rounds and the pt stated that he did not want the same NT tonight. He also stated "If that girl comes into this room tonight I will tackle her and there will be blood all over the room. She may as well go ahead and die before she steps foot in this hospital" I asked him to not speak in that manner and that was not okay. The pt then stated "I do not like black people and I do not want them to enter the room. I do not want anyone else to enter this room." RN left the room and informed the charge RN. [facility] was informed and came to the bedside with security to speak with the pt and the pt has not had any additional issues.  The staff for this pt has been changed for the night shift.

## 2020-05-12 NOTE — Plan of Care (Signed)
  Problem: Education: Goal: Knowledge of General Education information will improve Description: Including pain rating scale, medication(s)/side effects and non-pharmacologic comfort measures Outcome: Progressing   Problem: Clinical Measurements: Goal: Will remain free from infection Outcome: Progressing   

## 2020-05-12 NOTE — Progress Notes (Addendum)
PROGRESS NOTE    Gabriel Duarte  ZOX:[id number] DOB: [birth year] DOA: 05/08/2020 PCP: [facility], [facility]    Brief Narrative: 75 year old male with history of CKD stage III, chronic atrial fibrillation on Eliquis admitted with nausea vomiting chest pain and abdominal pain.  He was found to be in DKA treated with Endo tool now off of insulin drip.  He was also found to have bile duct stone status post ERCP biliary sphincterotomy and balloon extraction.  Assessment & Plan:   Principal Problem:   DKA (diabetic ketoacidosis) (HCC) Active Problems:   Choledocholithiasis   HTN (hypertension)   A-fib (HCC)   CKD (chronic kidney disease) stage 3, GFR 30-59 ml/min (HCC)   Pressure injury of skin   Umbilical hernia   #1 DKA type 1 diabetes-resolved.  Was treated with insulin drip and now resolved.  Increase Lantus to 10 units daily. Patient was on Glucotrol 10 mg twice a day, Jardiance 25 mg daily, Tradjenta 5 mg daily. ?  Unclear if this DKA was related to [facility] however I will not discharge him back home on Jardiance.  #2 choledocholithiasis status post ERCP biliary sphincterotomy and balloon extraction.  S/P LAP Chole 3/6   #3 chronic atrial fibrillation -restart eliquis when ok with surgery  #4 hypertension-was on Norvasc 10 mg daily propranolol 20 mg daily which has been on hold due to soft blood pressure.bp 131/65.  #5 stage I sacral pressure injury present on admission  #6 hypokalemia resolved .  #7 elevated LFTS -A Phos trending up,trasminases trending down Follow up in am  Pressure Injury 05/09/20 Sacrum Medial Stage 1 -  Intact skin with non-blanchable redness of a localized area usually over a bony prominence. (Active)  05/09/20 0400  Location: Sacrum  Location Orientation: Medial  Staging: Stage 1 -  Intact skin with non-blanchable redness of a localized area usually over a bony prominence.  Wound Description (Comments):   Present on Admission:  Yes     Estimated body mass index is 31.39 kg/m as calculated from the following:   Height as of this encounter: 5\' 7"  (1.702 m).   Weight as of this encounter: 90.9 kg.  DVT prophylaxis: None as patient is waiting for surgery and status post ERCP was on Eliquis at home Code Status: Full code Family Communication: None at bedside Disposition Plan:  Status is: Inpatient  Dispo: The patient is from: Home              Anticipated d/c is to: Home              Patient currently is not medically stable to d/c.   Difficult to place patient na   Consultants: GI and general surgery  Procedures: ERCP 05/10/2020 Lap chole 05/12/2020 Antimicrobials Zosyn  Subjective:  Anxious to have surgery done today. Objective: Vitals:   05/12/20 1030 05/12/20 1045 05/12/20 1100 05/12/20 1115  BP: (!) 148/86 (!) 143/71 132/74 131/65  Pulse: 68 78 72 79  Resp: (!) 25 (!) 24 19 (!) 22  Temp: 98.2 F (36.8 C)   98.3 F (36.8 C)  TempSrc:      SpO2: 93% 93% 91% 94%  Weight:      Height:        Intake/Output Summary (Last 24 hours) at 05/12/2020 1151 Last data filed at 05/12/2020 1028 Gross per 24 hour  Intake 912.06 ml  Output 10 ml  Net 902.06 ml   Filed Weights   05/08/20 1751 05/09/20 0236  Weight: 92.1 kg 90.9 kg    Examination:  General exam: Appears calm and comfortable  Respiratory system: Clear to auscultation. Respiratory effort normal. Cardiovascular system: S1 & S2 heard, RRR. No JVD, murmurs, rubs, gallops or clicks. No pedal edema. Gastrointestinal system: Abdomen is nondistended, soft and tender. No organomegaly or masses felt. Normal bowel sounds heard. Central nervous system: Alert and oriented. No focal neurological deficits. Extremities: Symmetric 5 x 5 power. Skin: No rashes, lesions or ulcers Psychiatry: Judgement and insight appear normal. Mood & affect appropriate.     Data Reviewed: I have personally reviewed following labs and imaging studies  CBC: Recent Labs   Lab 05/08/20 1908 05/08/20 2156 05/10/20 0509 05/11/20 0340 05/12/20 0737  WBC 8.3  --  6.0 6.0 4.7  HGB 14.2 12.6* 10.8* 12.0* 10.7*  HCT 40.9 37.0* 32.4* 37.1* 33.3*  MCV 86.7  --  89.5 91.6 91.2  PLT 147*  --  129* 149* 135*   Basic Metabolic Panel: Recent Labs  Lab 05/09/20 0300 05/09/20 0848 05/09/20 1223 05/10/20 0509 05/11/20 0340 05/12/20 0737  NA 138 136 138 136 138 136  K 3.3* 3.7 3.2* 3.3* 3.9 3.8  CL 97* 98 98 98 100 103  CO2 25 27 27 27 27  21*  GLUCOSE 210* 205* 171* 177* 125* 135*  BUN 34* 34* 30* 25* 21 14  CREATININE 1.40* 1.32* 1.31* 1.41* 1.21 0.90  CALCIUM 9.5 9.3 9.4 8.9 9.2 8.5*  MG 2.1 2.4  --   --   --   --    GFR: Estimated Creatinine Clearance: 77.4 mL/min (by C-G formula based on SCr of 0.9 mg/dL). Liver Function Tests: Recent Labs  Lab 05/08/20 1908 05/09/20 0848 05/10/20 0509 05/11/20 0340 05/12/20 0737  AST 303* 357* 196* 131* 99*  ALT 172* 255* 216* 189* 147*  ALKPHOS 360* 313* 301* 307* 407*  BILITOT 5.9* 6.1* 5.1* 5.0* 4.3*  PROT 7.6 5.8* 5.5* 6.0* 5.5*  ALBUMIN 4.2 3.0* 2.9* 2.9* 2.6*   Recent Labs  Lab 05/08/20 1908 05/11/20 0340 05/12/20 0737  LIPASE 39 357* 34   No results for input(s): AMMONIA in the last 168 hours. Coagulation Profile: Recent Labs  Lab 05/08/20 2345  INR 1.2   Cardiac Enzymes: No results for input(s): CKTOTAL, CKMB, CKMBINDEX, TROPONINI in the last 168 hours. BNP (last 3 results) No results for input(s): PROBNP in the last 8760 hours. HbA1C: No results for input(s): HGBA1C in the last 72 hours. CBG: Recent Labs  Lab 05/11/20 1140 05/11/20 1610 05/11/20 2053 05/12/20 0733 05/12/20 1042  GLUCAP 169* 182* 177* 129* 159*   Lipid Profile: No results for input(s): CHOL, HDL, LDLCALC, TRIG, CHOLHDL, LDLDIRECT in the last 72 hours. Thyroid Function Tests: No results for input(s): TSH, T4TOTAL, FREET4, T3FREE, THYROIDAB in the last 72 hours. Anemia Panel: No results for input(s):  VITAMINB12, FOLATE, FERRITIN, TIBC, IRON, RETICCTPCT in the last 72 hours. Sepsis Labs: No results for input(s): PROCALCITON, LATICACIDVEN in the last 168 hours.  Recent Results (from the past 240 hour(s))  Resp Panel by RT-PCR (Flu A&B, Covid) Nasopharyngeal Swab     Status: None   Collection Time: 05/08/20 11:45 PM   Specimen: Nasopharyngeal Swab; Nasopharyngeal(NP) swabs in vial transport medium  Result Value Ref Range Status   SARS Coronavirus 2 by RT PCR NEGATIVE NEGATIVE Final    Comment: (NOTE) SARS-CoV-2 target nucleic acids are NOT DETECTED.  The SARS-CoV-2 RNA is generally detectable in upper respiratory specimens during the acute phase of infection.  The lowest concentration of SARS-CoV-2 viral copies this assay can detect is 138 copies/mL. A negative result does not preclude SARS-Cov-2 infection and should not be used as the sole basis for treatment or other patient management decisions. A negative result may occur with  improper specimen collection/handling, submission of specimen other than nasopharyngeal swab, presence of viral mutation(s) within the areas targeted by this assay, and inadequate number of viral copies(<138 copies/mL). A negative result must be combined with clinical observations, patient history, and epidemiological information. The expected result is Negative.  Fact Sheet for Patients:  BloggerCourse.com  Fact Sheet for Healthcare Providers:  SeriousBroker.it  This test is no t yet approved or cleared by the [place] FDA and  has been authorized for detection and/or diagnosis of SARS-CoV-2 by FDA under an Emergency Use Authorization (EUA). This EUA will remain  in effect (meaning this test can be used) for the duration of the COVID-19 declaration under Section 564(b)(1) of the Act, 21 U.S.C.section 360bbb-3(b)(1), unless the authorization is terminated  or revoked sooner.       Influenza A  by PCR NEGATIVE NEGATIVE Final   Influenza B by PCR NEGATIVE NEGATIVE Final    Comment: (NOTE) The Xpert Xpress SARS-CoV-2/FLU/RSV plus assay is intended as an aid in the diagnosis of influenza from Nasopharyngeal swab specimens and should not be used as a sole basis for treatment. Nasal washings and aspirates are unacceptable for Xpert Xpress SARS-CoV-2/FLU/RSV testing.  Fact Sheet for Patients: BloggerCourse.com  Fact Sheet for Healthcare Providers: SeriousBroker.it  This test is not yet approved or cleared by the [place] FDA and has been authorized for detection and/or diagnosis of SARS-CoV-2 by FDA under an Emergency Use Authorization (EUA). This EUA will remain in effect (meaning this test can be used) for the duration of the COVID-19 declaration under Section 564(b)(1) of the Act, 21 U.S.C. section 360bbb-3(b)(1), unless the authorization is terminated or revoked.  Performed at [facility], [address] [place], [place] [address]   MRSA PCR Screening     Status: None   Collection Time: 05/09/20  2:35 AM   Specimen: Nasopharyngeal  Result Value Ref Range Status   MRSA by PCR NEGATIVE NEGATIVE Final    Comment:        The GeneXpert MRSA Assay (FDA approved for NASAL specimens only), is one component of a comprehensive MRSA colonization surveillance program. It is not intended to diagnose MRSA infection nor to guide or monitor treatment for MRSA infections. Performed at [facility], [address], [place], [place] [address]          Radiology Studies: No results found.      Scheduled Meds: . Chlorhexidine Gluconate Cloth  6 each Topical Daily  . fentaNYL      . indomethacin  100 mg Rectal Once  . insulin aspart  0-15 Units Subcutaneous TID AC & HS  . insulin aspart  2 Units Subcutaneous TID WC  . insulin glargine  10 Units Subcutaneous Daily  . mouth rinse  15  mL Mouth Rinse BID   Continuous Infusions: . sodium chloride 75 mL/hr at 05/11/20 1731  . lactated ringers       LOS: 3 days   Alwyn Ren, MD  05/12/2020, 11:51 AM

## 2020-05-12 NOTE — Progress Notes (Signed)
Patient ID: Gabriel Duarte, male   DOB: June 20, 1945, 75 y.o.   MRN: [id number]   Patient cleared by cardiology for laparoscopic cholecystectomy Continues to report minimal to no abdominal pain  Laparoscopic cholecystectomy with possible cholangiogram will be performed by Dr. Derrell Lolling later today  I discussed the procedure in detail.    We discussed the risks and benefits of a laparoscopic cholecystectomy and possible cholangiogram including, but not limited to bleeding, infection, injury to surrounding structures such as the intestine or liver, bile leak, retained gallstones, need to convert to an open procedure, prolonged diarrhea, blood clots such as  DVT, common bile duct injury, anesthesia risks, and possible need for additional procedures.  The likelihood of improvement in symptoms and return to the patient's normal status is good. We discussed the typical post-operative recovery course.

## 2020-05-12 NOTE — Anesthesia Preprocedure Evaluation (Addendum)
Anesthesia Evaluation  Patient identified by MRN, date of birth, ID band Patient awake    Reviewed: Allergy & Precautions, NPO status , Patient's Chart, lab work & pertinent test results, reviewed documented beta blocker date and time   History of Anesthesia Complications Negative for: history of anesthetic complications  Airway Mallampati: II  TM Distance: >3 FB Neck ROM: Full    Dental  (+) Dental Advisory Given   Pulmonary former smoker,    Pulmonary exam normal        Cardiovascular hypertension, Pt. on medications and Pt. on home beta blockers Normal cardiovascular exam+ dysrhythmias Atrial Fibrillation + pacemaker      Neuro/Psych negative neurological ROS  negative psych ROS   GI/Hepatic negative GI ROS, Neg liver ROS,   Endo/Other  diabetes, Type 2, Oral Hypoglycemic Agents Obesity   Renal/GU CRFRenal disease     Musculoskeletal negative musculoskeletal ROS (+)   Abdominal   Peds  Hematology  Thrombocytopenia, improved to 149k On eliquis    Anesthesia Other Findings Covid test negative   Reproductive/Obstetrics                           Anesthesia Physical  Anesthesia Plan  ASA: III  Anesthesia Plan: General   Post-op Pain Management:    Induction: Intravenous  PONV Risk Score and Plan: 3 and Treatment may vary due to age or medical condition, Ondansetron and Dexamethasone  Airway Management Planned: Oral ETT  Additional Equipment: None  Intra-op Plan:   Post-operative Plan: Extubation in OR  Informed Consent: I have reviewed the patients History and Physical, chart, labs and discussed the procedure including the risks, benefits and alternatives for the proposed anesthesia with the patient or authorized representative who has indicated his/her understanding and acceptance.     Dental advisory given  Plan Discussed with: CRNA and Anesthesiologist  Anesthesia  Plan Comments:        Anesthesia Quick Evaluation

## 2020-05-13 ENCOUNTER — Encounter (HOSPITAL_COMMUNITY): Payer: Self-pay | Admitting: General Surgery

## 2020-05-13 DIAGNOSIS — E1110 Type 2 diabetes mellitus with ketoacidosis without coma: Secondary | ICD-10-CM | POA: Diagnosis not present

## 2020-05-13 LAB — GLUCOSE, CAPILLARY
Glucose-Capillary: 192 mg/dL — ABNORMAL HIGH (ref 70–99)
Glucose-Capillary: 243 mg/dL — ABNORMAL HIGH (ref 70–99)
Glucose-Capillary: 311 mg/dL — ABNORMAL HIGH (ref 70–99)
Glucose-Capillary: 264 mg/dL — ABNORMAL HIGH (ref 70–99)

## 2020-05-13 LAB — COMPREHENSIVE METABOLIC PANEL
ALT: 111 U/L — ABNORMAL HIGH (ref 0–44)
AST: 56 U/L — ABNORMAL HIGH (ref 15–41)
Albumin: 2.5 g/dL — ABNORMAL LOW (ref 3.5–5.0)
Alkaline Phosphatase: 440 U/L — ABNORMAL HIGH (ref 38–126)
Anion gap: 15 (ref 5–15)
BUN: 25 mg/dL — ABNORMAL HIGH (ref 8–23)
CO2: 17 mmol/L — ABNORMAL LOW (ref 22–32)
Calcium: 8.3 mg/dL — ABNORMAL LOW (ref 8.9–10.3)
Chloride: 103 mmol/L (ref 98–111)
Creatinine, Ser: 1.24 mg/dL (ref 0.61–1.24)
GFR, Estimated: >60 mL/min (ref >60)
Glucose, Bld: 199 mg/dL — ABNORMAL HIGH (ref 70–99)
Potassium: 4.4 mmol/L (ref 3.5–5.1)
Sodium: 135 mmol/L (ref 135–145)
Total Bilirubin: 3.3 mg/dL — ABNORMAL HIGH (ref 0.3–1.2)
Total Protein: 5.5 g/dL — ABNORMAL LOW (ref 6.5–8.1)

## 2020-05-13 MED ORDER — TRAMADOL HCL 50 MG PO TABS
50.0000 mg | ORAL_TABLET | Freq: Four times a day (QID) | ORAL | Status: DC | PRN
Start: 2020-05-13 — End: 2020-05-14

## 2020-05-13 MED ORDER — INSULIN GLARGINE 100 UNIT/ML ~~LOC~~ SOLN
14.0000 [IU] | Freq: Every day | SUBCUTANEOUS | Status: DC
  Administered 2020-05-14: 14 [IU] via SUBCUTANEOUS
  Filled 2020-05-13: qty 0.14

## 2020-05-13 NOTE — Progress Notes (Signed)
[facility] Progress Note  1 Day Post-Op  Subjective: CC:  NAEO. Reports mild abd soreness. Denies flatus or BM yet. Has been mobilizing to the bathroom to urinate independently.   Objective: Vital signs in last 24 hours: Temp:  [97.5 F (36.4 C)-98.5 F (36.9 C)] 97.5 F (36.4 C) (03/07 7035) Pulse Rate:  [68-79] 73 (03/07 0638) Resp:  [16-25] 20 (03/07 0093) BP: (130-148)/(55-86) 130/76 (03/07 0638) SpO2:  [91 %-95 %] 95 % (03/07 8182) Last BM Date: 05/12/20  Intake/Output from previous day: 03/06 0701 - 03/07 0700 In: 932.8 [P.O.:480; I.V.:452.8] Out: 10 [Blood:10] Intake/Output this shift: No intake/output data recorded.  PE: Gen:  Alert, NAD, pleasant Abd: Soft, obese, approp tender, incisions C/D/I - mild ecchymosis of umbilicus  Skin: warm and dry, no rashes  Psych: A&Ox3   Lab Results:  Recent Labs    05/11/20 0340 05/12/20 0737  WBC 6.0 4.7  HGB 12.0* 10.7*  HCT 37.1* 33.3*  PLT 149* 135*   BMET Recent Labs    05/12/20 0737 05/13/20 0400  NA 136 135  K 3.8 4.4  CL 103 103  CO2 21* 17*  GLUCOSE 135* 199*  BUN 14 25*  CREATININE 0.90 1.24  CALCIUM 8.5* 8.3*   PT/INR No results for input(s): LABPROT, INR in the last 72 hours. CMP     Component Value Date/Time   NA 135 05/13/2020 0400   K 4.4 05/13/2020 0400   CL 103 05/13/2020 0400   CO2 17 (L) 05/13/2020 0400   GLUCOSE 199 (H) 05/13/2020 0400   BUN 25 (H) 05/13/2020 0400   CREATININE 1.24 05/13/2020 0400   CALCIUM 8.3 (L) 05/13/2020 0400   PROT 5.5 (L) 05/13/2020 0400   ALBUMIN 2.5 (L) 05/13/2020 0400   AST 56 (H) 05/13/2020 0400   ALT 111 (H) 05/13/2020 0400   ALKPHOS 440 (H) 05/13/2020 0400   BILITOT 3.3 (H) 05/13/2020 0400   GFRNONAA >60 05/13/2020 0400   Lipase     Component Value Date/Time   LIPASE 34 05/12/2020 0737       Studies/Results: No results found.  Anti-infectives: Anti-infectives (From admission, onward)   Start     Dose/Rate Route  Frequency Ordered Stop   05/12/20 0918  ceFAZolin (ANCEF) 2-4 GM/100ML-% IVPB       Note to Pharmacy: Helayne Seminole   : cabinet override      05/12/20 0918 05/12/20 0935   05/12/20 0830  ceFAZolin (ANCEF) IVPB 2g/100 mL premix  Status:  Discontinued        2 g 200 mL/hr over 30 Minutes Intravenous Every 8 hours 05/12/20 0736 05/12/20 0742   05/12/20 0830  ceFAZolin (ANCEF) IVPB 2g/100 mL premix        2 g 200 mL/hr over 30 Minutes Intravenous 30 min pre-op 05/12/20 0742 05/12/20 0919   05/09/20 0600  piperacillin-tazobactam (ZOSYN) IVPB 3.375 g  Status:  Discontinued        3.375 g 12.5 mL/hr over 240 Minutes Intravenous Every 8 hours 05/09/20 0002 05/11/20 1030   05/09/20 0015  piperacillin-tazobactam (ZOSYN) IVPB 3.375 g        3.375 g 100 mL/hr over 30 Minutes Intravenous  Once 05/09/20 0002 05/09/20 0110       Assessment/Plan Diabetes Mellitus, poorly controlled - A1c 11.1 HTN HLD afib on Eliquis (held) PMH PPM placement  CKD 3 Umbilical hernia - per pt present for over 10 years, a little larger than it used to be, not symptomatic, contains  fat, no bowel.  Choledocholithiasis - s/p ERCP sphincterotomy, balloon extraction 9-10 mm stone 05/10/20  Symptomatic cholelithiasis without cholecystitis  S/P laparoscopic cholecystectomy 05/12/20 - afebrile ,VSS, LFTs down-trending but not normalized (bili 3.3 from 4.3) - no signs of bleeding based on vitals/exam  - repeat CBC/CMP in AM. If LFTs downtrending and CBC stable ok to resume anticoagualtion tomorrow 3/8.  - advance diet as tolerated  - OOB/mobilize   FEN: CLD, ok to advance as tolerated to heart healthy/carb mod from CCS standpoint VTE: SCD's, lovenox ok today, resume anticaog tomorrow if CBC stable Foley: none, voiding spontaneously ID: perioperative ancef 3/6  Dispo: med surg, monitor labs, ADAT    LOS: 4 days    Hosie Spangle, [facility] Please see Amion for pager number during day hours  7:00am-4:30pm

## 2020-05-13 NOTE — Progress Notes (Signed)
PROGRESS NOTE    Gabriel Duarte  OEV:[id number] DOB: May 23, 1945 DOA: 05/08/2020 PCP: Inc, [facility]    Brief Narrative: 75 year old male with history of CKD stage III, chronic atrial fibrillation on Eliquis admitted with nausea vomiting chest pain and abdominal pain.  He was found to be in DKA treated with Endo tool now off of insulin drip.  He was also found to have bile duct stone status post ERCP biliary sphincterotomy and balloon extraction.  Assessment & Plan:   Principal Problem:   DKA (diabetic ketoacidosis) (HCC) Active Problems:   Choledocholithiasis   HTN (hypertension)   A-fib (HCC)   CKD (chronic kidney disease) stage 3, GFR 30-59 ml/min (HCC)   Pressure injury of skin   Umbilical hernia   #1 DKA type 1 diabetes-resolved.  Was treated with insulin drip and now resolved.  Increase Lantus to 14 units daily. Patient was on Glucotrol 10 mg twice a day, Jardiance 25 mg daily, Tradjenta 5 mg daily. ?  Unclear if this DKA was related to [facility] however . will not discharge him back home on Jardiance. CBG (last 3)  Recent Labs    05/12/20 1647 05/12/20 2047 05/13/20 0739  GLUCAP 215* 200* 192*     #2 choledocholithiasis status post ERCP biliary sphincterotomy and balloon extraction.  S/P LAP Chole 3/6  Advancing diet as tolerated  #3 chronic atrial fibrillation -restart eliquis 3/8 if hemoglobin remained stable.  #4 hypertension-blood pressure 130/76.  Was on Norvasc 10 mg daily with propranolol 20 mg daily at home which is on hold due to soft blood pressure.  #5 stage I sacral pressure injury present on admission  #6 hypokalemia resolved .  #7 elevated LFTS -A Phos trending up,trasminases trending down Follow up in am  Pressure Injury 05/09/20 Sacrum Medial Stage 1 -  Intact skin with non-blanchable redness of a localized area usually over a bony prominence. (Active)  05/09/20 0400  Location: Sacrum  Location Orientation: Medial   Staging: Stage 1 -  Intact skin with non-blanchable redness of a localized area usually over a bony prominence.  Wound Description (Comments):   Present on Admission: Yes     Estimated body mass index is 31.39 kg/m as calculated from the following:   Height as of this encounter: 5\' 7"  (1.702 m).   Weight as of this encounter: 90.9 kg.  DVT prophylaxis: None as patient is waiting for surgery and status post ERCP was on Eliquis at home Code Status: Full code Family Communication: None at bedside Disposition Plan:  Status is: Inpatient  Dispo: The patient is from: Home              Anticipated d/c is to: Home              Patient currently is not medically stable to d/c.   Difficult to place patient na   Consultants: GI and general surgery  Procedures: ERCP 05/10/2020 Lap chole 05/12/2020 Antimicrobials Zosyn  Subjective:  Anxious to have surgery done today. Objective: Vitals:   05/12/20 1115 05/12/20 1215 05/12/20 2050 05/13/20 0638  BP: 131/65 134/71 (!) 131/55 130/76  Pulse: 79 72 76 73  Resp: (!) 22 16 20 20   Temp: 98.3 F (36.8 C) 98.5 F (36.9 C) 97.7 F (36.5 C) (!) 97.5 F (36.4 C)  TempSrc:  Oral Oral Oral  SpO2: 94% 95% 95% 95%  Weight:      Height:        Intake/Output Summary (Last 24  hours) at 05/13/2020 1308 Last data filed at 05/12/2020 2241 Gross per 24 hour  Intake 932.78 ml  Output --  Net 932.78 ml   Filed Weights   05/08/20 1751 05/09/20 0236  Weight: 92.1 kg 90.9 kg    Examination:  General exam: Appears calm and comfortable  Respiratory system: Clear to auscultation. Respiratory effort normal. Cardiovascular system: S1 & S2 heard, RRR. No JVD, murmurs, rubs, gallops or clicks. No pedal edema. Gastrointestinal system: Abdomen is nondistended, soft and tender. No organomegaly or masses felt. Normal bowel sounds heard. Central nervous system: Alert and oriented. No focal neurological deficits. Extremities: Symmetric 5 x 5 power. Skin: No  rashes, lesions or ulcers Psychiatry: Judgement and insight appear normal. Mood & affect appropriate.     Data Reviewed: I have personally reviewed following labs and imaging studies  CBC: Recent Labs  Lab 05/08/20 1908 05/08/20 2156 05/10/20 0509 05/11/20 0340 05/12/20 0737  WBC 8.3  --  6.0 6.0 4.7  HGB 14.2 12.6* 10.8* 12.0* 10.7*  HCT 40.9 37.0* 32.4* 37.1* 33.3*  MCV 86.7  --  89.5 91.6 91.2  PLT 147*  --  129* 149* 135*   Basic Metabolic Panel: Recent Labs  Lab 05/09/20 0300 05/09/20 0848 05/09/20 1223 05/10/20 0509 05/11/20 0340 05/12/20 0737 05/13/20 0400  NA 138 136 138 136 138 136 135  K 3.3* 3.7 3.2* 3.3* 3.9 3.8 4.4  CL 97* 98 98 98 100 103 103  CO2 25 27 27 27 27  21* 17*  GLUCOSE 210* 205* 171* 177* 125* 135* 199*  BUN 34* 34* 30* 25* 21 14 25*  CREATININE 1.40* 1.32* 1.31* 1.41* 1.21 0.90 1.24  CALCIUM 9.5 9.3 9.4 8.9 9.2 8.5* 8.3*  MG 2.1 2.4  --   --   --   --   --    GFR: Estimated Creatinine Clearance: 56.2 mL/min (by C-G formula based on SCr of 1.24 mg/dL). Liver Function Tests: Recent Labs  Lab 05/09/20 0848 05/10/20 0509 05/11/20 0340 05/12/20 0737 05/13/20 0400  AST 357* 196* 131* 99* 56*  ALT 255* 216* 189* 147* 111*  ALKPHOS 313* 301* 307* 407* 440*  BILITOT 6.1* 5.1* 5.0* 4.3* 3.3*  PROT 5.8* 5.5* 6.0* 5.5* 5.5*  ALBUMIN 3.0* 2.9* 2.9* 2.6* 2.5*   Recent Labs  Lab 05/08/20 1908 05/11/20 0340 05/12/20 0737  LIPASE 39 357* 34   No results for input(s): AMMONIA in the last 168 hours. Coagulation Profile: Recent Labs  Lab 05/08/20 2345  INR 1.2   Cardiac Enzymes: No results for input(s): CKTOTAL, CKMB, CKMBINDEX, TROPONINI in the last 168 hours. BNP (last 3 results) No results for input(s): PROBNP in the last 8760 hours. HbA1C: No results for input(s): HGBA1C in the last 72 hours. CBG: Recent Labs  Lab 05/12/20 1042 05/12/20 1148 05/12/20 1647 05/12/20 2047 05/13/20 0739  GLUCAP 159* 185* 215* 200* 192*    Lipid Profile: No results for input(s): CHOL, HDL, LDLCALC, TRIG, CHOLHDL, LDLDIRECT in the last 72 hours. Thyroid Function Tests: No results for input(s): TSH, T4TOTAL, FREET4, T3FREE, THYROIDAB in the last 72 hours. Anemia Panel: No results for input(s): VITAMINB12, FOLATE, FERRITIN, TIBC, IRON, RETICCTPCT in the last 72 hours. Sepsis Labs: No results for input(s): PROCALCITON, LATICACIDVEN in the last 168 hours.  Recent Results (from the past 240 hour(s))  Resp Panel by RT-PCR (Flu A&B, Covid) Nasopharyngeal Swab     Status: None   Collection Time: 05/08/20 11:45 PM   Specimen: Nasopharyngeal Swab; Nasopharyngeal(NP) swabs in  vial transport medium  Result Value Ref Range Status   SARS Coronavirus 2 by RT PCR NEGATIVE NEGATIVE Final    Comment: (NOTE) SARS-CoV-2 target nucleic acids are NOT DETECTED.  The SARS-CoV-2 RNA is generally detectable in upper respiratory specimens during the acute phase of infection. The lowest concentration of SARS-CoV-2 viral copies this assay can detect is 138 copies/mL. A negative result does not preclude SARS-Cov-2 infection and should not be used as the sole basis for treatment or other patient management decisions. A negative result may occur with  improper specimen collection/handling, submission of specimen other than nasopharyngeal swab, presence of viral mutation(s) within the areas targeted by this assay, and inadequate number of viral copies(<138 copies/mL). A negative result must be combined with clinical observations, patient history, and epidemiological information. The expected result is Negative.  Fact Sheet for Patients:  BloggerCourse.com  Fact Sheet for Healthcare Providers:  SeriousBroker.it  This test is no t yet approved or cleared by the [place] FDA and  has been authorized for detection and/or diagnosis of SARS-CoV-2 by FDA under an Emergency Use Authorization  (EUA). This EUA will remain  in effect (meaning this test can be used) for the duration of the COVID-19 declaration under Section 564(b)(1) of the Act, 21 U.S.C.section 360bbb-3(b)(1), unless the authorization is terminated  or revoked sooner.       Influenza A by PCR NEGATIVE NEGATIVE Final   Influenza B by PCR NEGATIVE NEGATIVE Final    Comment: (NOTE) The Xpert Xpress SARS-CoV-2/FLU/RSV plus assay is intended as an aid in the diagnosis of influenza from Nasopharyngeal swab specimens and should not be used as a sole basis for treatment. Nasal washings and aspirates are unacceptable for Xpert Xpress SARS-CoV-2/FLU/RSV testing.  Fact Sheet for Patients: BloggerCourse.com  Fact Sheet for Healthcare Providers: SeriousBroker.it  This test is not yet approved or cleared by the [place] FDA and has been authorized for detection and/or diagnosis of SARS-CoV-2 by FDA under an Emergency Use Authorization (EUA). This EUA will remain in effect (meaning this test can be used) for the duration of the COVID-19 declaration under Section 564(b)(1) of the Act, 21 U.S.C. section 360bbb-3(b)(1), unless the authorization is terminated or revoked.  Performed at [facility], [address] [place], [place] [address]   MRSA PCR Screening     Status: None   Collection Time: 05/09/20  2:35 AM   Specimen: Nasopharyngeal  Result Value Ref Range Status   MRSA by PCR NEGATIVE NEGATIVE Final    Comment:        The GeneXpert MRSA Assay (FDA approved for NASAL specimens only), is one component of a comprehensive MRSA colonization surveillance program. It is not intended to diagnose MRSA infection nor to guide or monitor treatment for MRSA infections. Performed at [facility], [address] [place], [place] [address]          Radiology Studies: No results found.      Scheduled Meds: .  Chlorhexidine Gluconate Cloth  6 each Topical Daily  . indomethacin  100 mg Rectal Once  . insulin aspart  0-15 Units Subcutaneous TID AC & HS  . insulin aspart  2 Units Subcutaneous TID WC  . insulin glargine  10 Units Subcutaneous Daily  . mouth rinse  15 mL Mouth Rinse BID   Continuous Infusions: . sodium chloride 75 mL/hr at 05/12/20 2013  . lactated ringers       LOS: 4 days   Lanora Manis  Cleopatra CedarG Tatianna Ibbotson, MD  05/13/2020, 1:08 PM

## 2020-05-13 NOTE — Progress Notes (Signed)
PROGRESS NOTE    Gabriel Duarte  ZOX:[id number] DOB: Oct 17, 1945 DOA: 05/08/2020 PCP: Inc, [facility]    Brief Narrative: 75 year old male with history of CKD stage III, chronic atrial fibrillation on Eliquis admitted with nausea vomiting chest pain and abdominal pain.  He was found to be in DKA treated with Endo tool now off of insulin drip.  He was also found to have bile duct stone status post ERCP biliary sphincterotomy and balloon extraction.  Assessment & Plan:   Principal Problem:   DKA (diabetic ketoacidosis) (HCC) Active Problems:   Choledocholithiasis   HTN (hypertension)   A-fib (HCC)   CKD (chronic kidney disease) stage 3, GFR 30-59 ml/min (HCC)   Pressure injury of skin   Umbilical hernia   #1 DKA type 1 diabetes-resolved.  Was treated with insulin drip and now resolved.  Increase Lantus to 14 units daily. Patient was on Glucotrol 10 mg twice a day, Jardiance 25 mg daily, Tradjenta 5 mg daily. ?  Unclear if this DKA was related to [facility] however . will not discharge him back home on Jardiance. CBG (last 3)  Recent Labs    05/12/20 2047 05/13/20 0739 05/13/20 1309  GLUCAP 200* 192* 243*     #2 choledocholithiasis status post ERCP biliary sphincterotomy and balloon extraction.  S/P LAP Chole 3/6  Advancing diet as tolerated  #3 chronic atrial fibrillation -restart eliquis 3/8 if hemoglobin remained stable.  #4 hypertension-blood pressure 130/76.  Was on Norvasc 10 mg daily with propranolol 20 mg daily at home which is on hold due to soft blood pressure.  #5 stage I sacral pressure injury present on admission  #6 hypokalemia resolved .  #7 elevated LFTS -A Phos trending up,trasminases trending down Follow up in am  Pressure Injury 05/09/20 Sacrum Medial Stage 1 -  Intact skin with non-blanchable redness of a localized area usually over a bony prominence. (Active)  05/09/20 0400  Location: Sacrum  Location Orientation: Medial   Staging: Stage 1 -  Intact skin with non-blanchable redness of a localized area usually over a bony prominence.  Wound Description (Comments):   Present on Admission: Yes     Estimated body mass index is 31.39 kg/m as calculated from the following:   Height as of this encounter: 5\' 7"  (1.702 m).   Weight as of this encounter: 90.9 kg.  DVT prophylaxis: None he is status post surgery restart Eliquis 05/14/2020 if hemoglobin remained stable.   Code Status: Full code Family Communication: None at bedside Disposition Plan:  Status is: Inpatient  Dispo: The patient is from: Home              Anticipated d/c is to: Home              Patient currently is not medically stable to d/c.   Difficult to place patient na   Consultants: GI and general surgery  Procedures: ERCP 05/10/2020 Lap chole 05/12/2020 Antimicrobials Zosyn  Subjective:  Anxious to eat some regular food has not had a bowel movement or had any flatus.  Walking around in the room. Objective: Vitals:   05/12/20 1115 05/12/20 1215 05/12/20 2050 05/13/20 0638  BP: 131/65 134/71 (!) 131/55 130/76  Pulse: 79 72 76 73  Resp: (!) 22 16 20 20   Temp: 98.3 F (36.8 C) 98.5 F (36.9 C) 97.7 F (36.5 C) (!) 97.5 F (36.4 C)  TempSrc:  Oral Oral Oral  SpO2: 94% 95% 95% 95%  Weight:  Height:        Intake/Output Summary (Last 24 hours) at 05/13/2020 1318 Last data filed at 05/12/2020 2241 Gross per 24 hour  Intake 932.78 ml  Output --  Net 932.78 ml   Filed Weights   05/08/20 1751 05/09/20 0236  Weight: 92.1 kg 90.9 kg    Examination:  General exam: Appears calm and comfortable  Respiratory system: Clear to auscultation. Respiratory effort normal. Cardiovascular system: S1 & S2 heard, RRR. No JVD, murmurs, rubs, gallops or clicks. No pedal edema. Gastrointestinal system: Abdomen is nondistended, soft and tender. No organomegaly or masses felt. Normal bowel sounds heard. Central nervous system: Alert and oriented.  No focal neurological deficits. Extremities: Symmetric 5 x 5 power. Skin: No rashes, lesions or ulcers Psychiatry: Judgement and insight appear normal. Mood & affect appropriate.     Data Reviewed: I have personally reviewed following labs and imaging studies  CBC: Recent Labs  Lab 05/08/20 1908 05/08/20 2156 05/10/20 0509 05/11/20 0340 05/12/20 0737  WBC 8.3  --  6.0 6.0 4.7  HGB 14.2 12.6* 10.8* 12.0* 10.7*  HCT 40.9 37.0* 32.4* 37.1* 33.3*  MCV 86.7  --  89.5 91.6 91.2  PLT 147*  --  129* 149* 135*   Basic Metabolic Panel: Recent Labs  Lab 05/09/20 0300 05/09/20 0848 05/09/20 1223 05/10/20 0509 05/11/20 0340 05/12/20 0737 05/13/20 0400  NA 138 136 138 136 138 136 135  K 3.3* 3.7 3.2* 3.3* 3.9 3.8 4.4  CL 97* 98 98 98 100 103 103  CO2 25 27 27 27 27  21* 17*  GLUCOSE 210* 205* 171* 177* 125* 135* 199*  BUN 34* 34* 30* 25* 21 14 25*  CREATININE 1.40* 1.32* 1.31* 1.41* 1.21 0.90 1.24  CALCIUM 9.5 9.3 9.4 8.9 9.2 8.5* 8.3*  MG 2.1 2.4  --   --   --   --   --    GFR: Estimated Creatinine Clearance: 56.2 mL/min (by C-G formula based on SCr of 1.24 mg/dL). Liver Function Tests: Recent Labs  Lab 05/09/20 0848 05/10/20 0509 05/11/20 0340 05/12/20 0737 05/13/20 0400  AST 357* 196* 131* 99* 56*  ALT 255* 216* 189* 147* 111*  ALKPHOS 313* 301* 307* 407* 440*  BILITOT 6.1* 5.1* 5.0* 4.3* 3.3*  PROT 5.8* 5.5* 6.0* 5.5* 5.5*  ALBUMIN 3.0* 2.9* 2.9* 2.6* 2.5*   Recent Labs  Lab 05/08/20 1908 05/11/20 0340 05/12/20 0737  LIPASE 39 357* 34   No results for input(s): AMMONIA in the last 168 hours. Coagulation Profile: Recent Labs  Lab 05/08/20 2345  INR 1.2   Cardiac Enzymes: No results for input(s): CKTOTAL, CKMB, CKMBINDEX, TROPONINI in the last 168 hours. BNP (last 3 results) No results for input(s): PROBNP in the last 8760 hours. HbA1C: No results for input(s): HGBA1C in the last 72 hours. CBG: Recent Labs  Lab 05/12/20 1148 05/12/20 1647  05/12/20 2047 05/13/20 0739 05/13/20 1309  GLUCAP 185* 215* 200* 192* 243*   Lipid Profile: No results for input(s): CHOL, HDL, LDLCALC, TRIG, CHOLHDL, LDLDIRECT in the last 72 hours. Thyroid Function Tests: No results for input(s): TSH, T4TOTAL, FREET4, T3FREE, THYROIDAB in the last 72 hours. Anemia Panel: No results for input(s): VITAMINB12, FOLATE, FERRITIN, TIBC, IRON, RETICCTPCT in the last 72 hours. Sepsis Labs: No results for input(s): PROCALCITON, LATICACIDVEN in the last 168 hours.  Recent Results (from the past 240 hour(s))  Resp Panel by RT-PCR (Flu A&B, Covid) Nasopharyngeal Swab     Status: None   Collection  Time: 05/08/20 11:45 PM   Specimen: Nasopharyngeal Swab; Nasopharyngeal(NP) swabs in vial transport medium  Result Value Ref Range Status   SARS Coronavirus 2 by RT PCR NEGATIVE NEGATIVE Final    Comment: (NOTE) SARS-CoV-2 target nucleic acids are NOT DETECTED.  The SARS-CoV-2 RNA is generally detectable in upper respiratory specimens during the acute phase of infection. The lowest concentration of SARS-CoV-2 viral copies this assay can detect is 138 copies/mL. A negative result does not preclude SARS-Cov-2 infection and should not be used as the sole basis for treatment or other patient management decisions. A negative result may occur with  improper specimen collection/handling, submission of specimen other than nasopharyngeal swab, presence of viral mutation(s) within the areas targeted by this assay, and inadequate number of viral copies(<138 copies/mL). A negative result must be combined with clinical observations, patient history, and epidemiological information. The expected result is Negative.  Fact Sheet for Patients:  BloggerCourse.com  Fact Sheet for Healthcare Providers:  SeriousBroker.it  This test is no t yet approved or cleared by the [place] FDA and  has been authorized for detection  and/or diagnosis of SARS-CoV-2 by FDA under an Emergency Use Authorization (EUA). This EUA will remain  in effect (meaning this test can be used) for the duration of the COVID-19 declaration under Section 564(b)(1) of the Act, 21 U.S.C.section 360bbb-3(b)(1), unless the authorization is terminated  or revoked sooner.       Influenza A by PCR NEGATIVE NEGATIVE Final   Influenza B by PCR NEGATIVE NEGATIVE Final    Comment: (NOTE) The Xpert Xpress SARS-CoV-2/FLU/RSV plus assay is intended as an aid in the diagnosis of influenza from Nasopharyngeal swab specimens and should not be used as a sole basis for treatment. Nasal washings and aspirates are unacceptable for Xpert Xpress SARS-CoV-2/FLU/RSV testing.  Fact Sheet for Patients: BloggerCourse.com  Fact Sheet for Healthcare Providers: SeriousBroker.it  This test is not yet approved or cleared by the [place] FDA and has been authorized for detection and/or diagnosis of SARS-CoV-2 by FDA under an Emergency Use Authorization (EUA). This EUA will remain in effect (meaning this test can be used) for the duration of the COVID-19 declaration under Section 564(b)(1) of the Act, 21 U.S.C. section 360bbb-3(b)(1), unless the authorization is terminated or revoked.  Performed at [facility], [address] [place], [place] [address]   MRSA PCR Screening     Status: None   Collection Time: 05/09/20  2:35 AM   Specimen: Nasopharyngeal  Result Value Ref Range Status   MRSA by PCR NEGATIVE NEGATIVE Final    Comment:        The GeneXpert MRSA Assay (FDA approved for NASAL specimens only), is one component of a comprehensive MRSA colonization surveillance program. It is not intended to diagnose MRSA infection nor to guide or monitor treatment for MRSA infections. Performed at [facility], [address] [place], [place] [address]           Radiology Studies: No results found.      Scheduled Meds: . Chlorhexidine Gluconate Cloth  6 each Topical Daily  . indomethacin  100 mg Rectal Once  . insulin aspart  0-15 Units Subcutaneous TID AC & HS  . insulin aspart  2 Units Subcutaneous TID WC  . insulin glargine  10 Units Subcutaneous Daily  . mouth rinse  15 mL Mouth Rinse BID   Continuous Infusions: . lactated ringers       LOS: 4 days  Alwyn Ren, MD  05/13/2020, 1:18 PM

## 2020-05-14 DIAGNOSIS — I4821 Permanent atrial fibrillation: Secondary | ICD-10-CM | POA: Diagnosis not present

## 2020-05-14 DIAGNOSIS — K8050 Calculus of bile duct without cholangitis or cholecystitis without obstruction: Secondary | ICD-10-CM | POA: Diagnosis not present

## 2020-05-14 DIAGNOSIS — E1110 Type 2 diabetes mellitus with ketoacidosis without coma: Secondary | ICD-10-CM | POA: Diagnosis not present

## 2020-05-14 DIAGNOSIS — N183 Chronic kidney disease, stage 3 unspecified: Secondary | ICD-10-CM | POA: Diagnosis not present

## 2020-05-14 LAB — CBC
HCT: 36.2 % — ABNORMAL LOW (ref 39.0–52.0)
Hemoglobin: 11.2 g/dL — ABNORMAL LOW (ref 13.0–17.0)
MCH: 28.7 pg (ref 26.0–34.0)
MCHC: 30.9 g/dL (ref 30.0–36.0)
MCV: 92.8 fL (ref 80.0–100.0)
Platelets: 162 10*3/uL (ref 150–400)
RBC: 3.9 MIL/uL — ABNORMAL LOW (ref 4.22–5.81)
RDW: 14.8 % (ref 11.5–15.5)
WBC: 6.1 10*3/uL (ref 4.0–10.5)
nRBC: 0 % (ref 0.0–0.2)

## 2020-05-14 LAB — COMPREHENSIVE METABOLIC PANEL
ALT: 94 U/L — ABNORMAL HIGH (ref 0–44)
AST: 61 U/L — ABNORMAL HIGH (ref 15–41)
Albumin: 2.4 g/dL — ABNORMAL LOW (ref 3.5–5.0)
Alkaline Phosphatase: 447 U/L — ABNORMAL HIGH (ref 38–126)
Anion gap: 7 (ref 5–15)
BUN: 31 mg/dL — ABNORMAL HIGH (ref 8–23)
CO2: 23 mmol/L (ref 22–32)
Calcium: 8.2 mg/dL — ABNORMAL LOW (ref 8.9–10.3)
Chloride: 107 mmol/L (ref 98–111)
Creatinine, Ser: 1.1 mg/dL (ref 0.61–1.24)
GFR, Estimated: >60 mL/min (ref >60)
Glucose, Bld: 176 mg/dL — ABNORMAL HIGH (ref 70–99)
Potassium: 3.7 mmol/L (ref 3.5–5.1)
Sodium: 137 mmol/L (ref 135–145)
Total Bilirubin: 2.2 mg/dL — ABNORMAL HIGH (ref 0.3–1.2)
Total Protein: 5.1 g/dL — ABNORMAL LOW (ref 6.5–8.1)

## 2020-05-14 LAB — GLUCOSE, CAPILLARY
Glucose-Capillary: 161 mg/dL — ABNORMAL HIGH (ref 70–99)
Glucose-Capillary: 212 mg/dL — ABNORMAL HIGH (ref 70–99)

## 2020-05-14 LAB — SURGICAL PATHOLOGY

## 2020-05-14 NOTE — Progress Notes (Signed)
Went over discharge instructions w/ pt. Pt verbalized understanding.  

## 2020-05-14 NOTE — Discharge Instructions (Signed)
[facility] LAPAROSCOPIC SURGERY: POST OP INSTRUCTIONS Always review your discharge instruction sheet given to you by the facility where your surgery was performed. IF YOU HAVE DISABILITY OR FAMILY LEAVE FORMS, YOU MUST BRING THEM TO THE OFFICE FOR PROCESSING.   DO NOT GIVE THEM TO YOUR DOCTOR.  PAIN CONTROL  1. First take acetaminophen (Tylenol) AND/or ibuprofen (Advil) to control your pain after surgery.  Follow directions on package.  Taking acetaminophen (Tylenol) and/or ibuprofen (Advil) regularly after surgery will help to control your pain and lower the amount of prescription pain medication you may need.  You should not take more than 3,000 mg (3 grams) of acetaminophen (Tylenol) in 24 hours.  You should not take ibuprofen (Advil), aleve, motrin, naprosyn or other NSAIDS if you have a history of stomach ulcers or chronic kidney disease.  2. A prescription for pain medication may be given to you upon discharge.  Take your pain medication as prescribed, if you still have uncontrolled pain after taking acetaminophen (Tylenol) or ibuprofen (Advil). 3. Use ice packs to help control pain. 4. If you need a refill on your pain medication, please contact your pharmacy.  They will contact our office to request authorization. Prescriptions will not be filled after 5pm or on week-ends.  HOME MEDICATIONS 5. Take your usually prescribed medications unless otherwise directed.  DIET 6. You should follow a light diet the first few days after arrival home.  Be sure to include lots of fluids daily. Avoid fatty, fried foods.   CONSTIPATION 7. It is common to experience some constipation after surgery and if you are taking pain medication.  Increasing fluid intake and taking a stool softener (such as Colace) will usually help or prevent this problem from occurring.  A mild laxative (Milk of Magnesia or Miralax) should be taken according to package instructions if there are no bowel  movements after 48 hours.  WOUND/INCISION CARE 8. Most patients will experience some swelling and bruising in the area of the incisions.  Ice packs will help.  Swelling and bruising can take several days to resolve.  9. Unless discharge instructions indicate otherwise, follow guidelines below  a. STERI-STRIPS - you may remove your outer bandages 48 hours after surgery, and you may shower at that time.  You have steri-strips (small skin tapes) in place directly over the incision.  These strips should be left on the skin for 7-10 days.   b. DERMABOND/SKIN GLUE - you may shower in 24 hours.  The glue will flake off over the next 2-3 weeks. 10. Any sutures or staples will be removed at the office during your follow-up visit.  ACTIVITIES 11. You may resume regular (light) daily activities beginning the next day--such as daily self-care, walking, climbing stairs--gradually increasing activities as tolerated.  You may have sexual intercourse when it is comfortable.  Refrain from any heavy lifting or straining until approved by your doctor. a. You may drive when you are no longer taking prescription pain medication, you can comfortably wear a seatbelt, and you can safely maneuver your car and apply brakes.  FOLLOW-UP 12. You should see your doctor in the office for a follow-up appointment approximately 2-3 weeks after your surgery.  You should have been given your post-op/follow-up appointment when your surgery was scheduled.  If you did not receive a post-op/follow-up appointment, make sure that you call for this appointment within a day or two after you arrive home to insure a convenient appointment time.  WHEN   TO CALL YOUR DOCTOR: 1. Fever over 101.0 2. Inability to urinate 3. Continued bleeding from incision. 4. Increased pain, redness, or drainage from the incision. 5. Increasing abdominal pain  The clinic staff is available to answer your questions during regular business hours.  Please don't  hesitate to call and ask to speak to one of the nurses for clinical concerns.  If you have a medical emergency, go to the nearest emergency room or call 911.  A surgeon from [facility] is always on call at the hospital. [address], [address], [place], [place]  [address] ? [address] [address], , [place]   [address] [phone] ? [phone] ? FAX [phone] Web site: [email or web address]  

## 2020-05-14 NOTE — Progress Notes (Signed)
[facility] Progress Note  2 Days Post-Op  Subjective: CC:  No new complaints. Pain controlled. Tolerating PO. Mobilizing in his room.   Objective: Vital signs in last 24 hours: Temp:  [97.8 F (36.6 C)-98.8 F (37.1 C)] 98.6 F (37 C) (03/08 0355) Pulse Rate:  [66-75] 75 (03/08 0355) Resp:  [16-18] 16 (03/08 0355) BP: (134-150)/(61-79) 150/69 (03/08 0355) SpO2:  [94 %-97 %] 94 % (03/08 0355) Last BM Date: 05/09/20  Intake/Output from previous day: No intake/output data recorded. Intake/Output this shift: No intake/output data recorded.  PE: Gen:  Alert, NAD, pleasant Abd: Soft, obese, approp tender, incisions C/D/I - mild ecchymosis of umbilicus  Skin: warm and dry, no rashes  Psych: A&Ox3   Lab Results:  Recent Labs    05/12/20 0737  WBC 4.7  HGB 10.7*  HCT 33.3*  PLT 135*   BMET Recent Labs    05/13/20 0400 05/14/20 0359  NA 135 137  K 4.4 3.7  CL 103 107  CO2 17* 23  GLUCOSE 199* 176*  BUN 25* 31*  CREATININE 1.24 1.10  CALCIUM 8.3* 8.2*   PT/INR No results for input(s): LABPROT, INR in the last 72 hours. CMP     Component Value Date/Time   NA 137 05/14/2020 0359   K 3.7 05/14/2020 0359   CL 107 05/14/2020 0359   CO2 23 05/14/2020 0359   GLUCOSE 176 (H) 05/14/2020 0359   BUN 31 (H) 05/14/2020 0359   CREATININE 1.10 05/14/2020 0359   CALCIUM 8.2 (L) 05/14/2020 0359   PROT 5.1 (L) 05/14/2020 0359   ALBUMIN 2.4 (L) 05/14/2020 0359   AST 61 (H) 05/14/2020 0359   ALT 94 (H) 05/14/2020 0359   ALKPHOS 447 (H) 05/14/2020 0359   BILITOT 2.2 (H) 05/14/2020 0359   GFRNONAA >60 05/14/2020 0359   Lipase     Component Value Date/Time   LIPASE 34 05/12/2020 0737       Studies/Results: No results found.  Anti-infectives: Anti-infectives (From admission, onward)   Start     Dose/Rate Route Frequency Ordered Stop   05/12/20 0918  ceFAZolin (ANCEF) 2-4 GM/100ML-% IVPB       Note to Pharmacy: Helayne [facility]   : cabinet override       05/12/20 0918 05/12/20 0935   05/12/20 0830  ceFAZolin (ANCEF) IVPB 2g/100 mL premix  Status:  Discontinued        2 g 200 mL/hr over 30 Minutes Intravenous Every 8 hours 05/12/20 0736 05/12/20 0742   05/12/20 0830  ceFAZolin (ANCEF) IVPB 2g/100 mL premix        2 g 200 mL/hr over 30 Minutes Intravenous 30 min pre-op 05/12/20 0742 05/12/20 0919   05/09/20 0600  piperacillin-tazobactam (ZOSYN) IVPB 3.375 g  Status:  Discontinued        3.375 g 12.5 mL/hr over 240 Minutes Intravenous Every 8 hours 05/09/20 0002 05/11/20 1030   05/09/20 0015  piperacillin-tazobactam (ZOSYN) IVPB 3.375 g        3.375 g 100 mL/hr over 30 Minutes Intravenous  Once 05/09/20 0002 05/09/20 0110       Assessment/Plan Diabetes Mellitus, poorly controlled - A1c 11.1 HTN HLD afib on Eliquis (held) PMH PPM placement  CKD 3 Umbilical hernia - per pt present for over 10 years, a little larger than it used to be, not symptomatic, contains fat, no bowel.  Choledocholithiasis - s/p ERCP sphincterotomy, balloon extraction 9-10 mm stone 05/10/20  Symptomatic cholelithiasis without cholecystitis  S/P laparoscopic  cholecystectomy 05/12/20 - afebrile ,VSS, LFTs down-trending but not normalized (bili 4.3 >> 3.3 >> 2.2) - no signs of bleeding based on vitals/exam  - CBC stable  ok to resume Eliquis - OOB/mobilize   FEN: heart healthy/carb mod VTE: SCD's, resume Eliquis per primary team Foley: none, voiding spontaneously ID: perioperative ancef 3/6  Dispo: med surg, stable for discharge from a surgical perspective. Follow up provided. Pt not requiring narcotic Rx post-op. He may take tylenol PRN at home for pain.    LOS: 5 days    Hosie Spangle, [facility] Please see Amion for pager number during day hours 7:00am-4:30pm

## 2020-05-16 NOTE — Discharge Summary (Signed)
Physician Discharge Summary  Paulina FusiJerry Smucker ZOX:[id number] DOB: June 28, 1945 DOA: 05/08/2020  PCP: [facility]  Admit date: 05/08/2020 Discharge date: 05/14/2020  Admitted From: Home.  Disposition:  Home.   Recommendations for Outpatient Follow-up:  1. Follow up with PCP in 1-2 weeks 2. Please obtain BMP/CBC in one week 3.         Please follow up with general surgery if needed.   Discharge Condition: stable.  CODE STATUS: FULL CODE  Diet recommendation: Heart Healthy  Brief/Interim Summary:  75 year old male with history of CKD stage III, chronic atrial fibrillation on Eliquis admitted with nausea vomiting chest pain and abdominal pain.  He was found to be in DKA treated with Endo tool now off of insulin drip.  He was also found to have bile duct stone status post ERCP biliary sphincterotomy and balloon extraction.  Discharge Diagnoses:  Principal Problem:   DKA (diabetic ketoacidosis) (HCC) Active Problems:   Choledocholithiasis   HTN (hypertension)   A-fib (HCC)   CKD (chronic kidney disease) stage 3, GFR 30-59 ml/min (HCC)   Pressure injury of skin   Umbilical hernia  DKA: resolved.  Was treated with insulin drip and now resolved.  Increase Lantus to 14 units daily. Patient was on Glucotrol 10 mg twice a day, Jardiance 25 mg daily, Tradjenta 5 mg daily. ?  Unclear if [facility] . will not discharge him back home on Jardiance.  #2 choledocholithiasis status post ERCP biliary sphincterotomy and balloon extraction.  S/P LAP Chole 3/6  Advancing diet as tolerated  #3 chronic atrial fibrillation -restarted eliquis 3/8 if hemoglobin remained stable.  #4 hypertension-well controlled.   #5 stage I sacral pressure injury present on admission  #6 hypokalemia resolved .    Discharge Instructions  Discharge Instructions    Diet - low sodium heart healthy   Complete by: As directed    Discharge instructions    Complete by: As directed    Follow up with PCP and cardiology as scheduled.   Increase activity slowly   Complete by: As directed    No wound care   Complete by: As directed      Allergies as of 05/14/2020   No Known Allergies     Medication List    STOP taking these medications   Jardiance 25 MG Tabs tablet Generic drug: empagliflozin     TAKE these medications   amLODipine 10 MG tablet Commonly known as: NORVASC Take 10 mg by mouth daily.   Cholecalciferol 125 MCG (5000 UT) Tabs Take 1 tablet by mouth daily.   Eliquis 5 MG Tabs tablet Generic drug: apixaban Take 5 mg by mouth at bedtime.   Fish Oil 1000 MG Caps Take 1 capsule by mouth daily.   glipiZIDE 10 MG tablet Commonly known as: GLUCOTROL Take 10 mg by mouth 2 (two) times daily before a meal.   linagliptin 5 MG Tabs tablet Commonly known as: TRADJENTA Take 1 tablet by mouth daily.   pravastatin 20 MG tablet Commonly known as: PRAVACHOL Take 20 mg by mouth at bedtime.   propranolol 20 MG tablet Commonly known as: INDERAL Take 20 mg by mouth daily.   vitamin B-12 1000 MCG tablet Commonly known as: CYANOCOBALAMIN Take 1,000 mcg by mouth daily.       Follow-up Information    [facility] Follow up.   Specialty: Pediatrics Contact information: [address] [place] [address]  [phone]        [facility] Follow up.   Specialty: General Surgery Why: our office is scheduling you for post-operative follow up in 2-3 weeks after you recent gallbladder surgery, please call to confirm appointment date/time. Contact information: [address] [place] [address] [phone]             No Known Allergies  Consultations:  General surgery.    Procedures/Studies: CT Abdomen Pelvis Wo Contrast  Result Date: 05/08/2020 CLINICAL DATA:  Acute nonlocalized abdominal pain, constipation, transaminitis  EXAM: CT ABDOMEN AND PELVIS WITHOUT CONTRAST TECHNIQUE: Multidetector CT imaging of the abdomen and pelvis was performed following the standard protocol without IV contrast. COMPARISON:  None. FINDINGS: Lower chest: The visualized lung bases are clear. Mediastinal shift to the left. Extensive multi-vessel coronary artery calcification. Mild global cardiomegaly. Metallic foreign body is seen within the right ventricular outflow tract. Mild pericardial calcification Hepatobiliary: Layering hyperdensity within the gallbladder likely represents sludge or layering gallstones. No pericholecystic inflammatory changes identified, however. The liver is unremarkable. No intra or extrahepatic biliary ductal dilation. 8 mm calcified calculus or debris is seen within the distal common duct in keeping with choledocholithiasis. Pancreas: Unremarkable Spleen: Unremarkable Adrenals/Urinary Tract: The adrenal glands are unremarkable. The kidneys are normal in size and position. 2.4 cm exophytic simple cortical cyst arises from the lower pole of the left kidney. Immediately adjacent to this a homogeneously hyperdense lesion measuring 2.5 cm is seen most in keeping with a hyperdense renal cyst containing probable milk of calcium. The kidneys are otherwise unremarkable. Bladder unremarkable. Stomach/Bowel: Mild sigmoid diverticulosis. The stomach, small bowel, and large bowel are otherwise unremarkable. Appendix absent. No free intraperitoneal gas or fluid. Vascular/Lymphatic: Extensive aortoiliac atherosclerotic calcification. No aortic aneurysm. No pathologic adenopathy within the abdomen and pelvis. Reproductive: Prostate is unremarkable. Other: Moderate fat containing umbilical hernia. Mild soft tissue infiltration involving the subcutaneous fat surrounding the hernia sac may relate to early changes of incarceration. Musculoskeletal: Degenerative changes are seen within the lumbar spine. No lytic or blastic bone lesion. IMPRESSION:  Choledocholithiasis with 8 mm calculus or calcific debris within the distal duct at the level of the ampulla. Cholelithiasis. Moderate fat containing umbilical hernia with surrounding inflammatory stranding within the subcutaneous fat of the anterior abdominal wall possibly representing early changes of incarceration. Aortic Atherosclerosis (ICD10-I70.0). Electronically Signed   By: Helyn Numbers MD   On: 05/08/2020 23:35   DG Chest 2 View  Result Date: 05/08/2020 CLINICAL DATA:  Chest pain EXAM: CHEST - 2 VIEW COMPARISON:  Chest x-ray dated 06/30/2017. FINDINGS: Stable cardiomegaly. Lungs are clear. No pleural effusion or pneumothorax is seen. Osseous structures about the chest are unremarkable. IMPRESSION: No active cardiopulmonary disease. No evidence of pneumonia or pulmonary edema. Stable cardiomegaly. Electronically Signed   By: Bary Richard M.D.   On: 05/08/2020 18:26   DG ERCP BILIARY & PANCREATIC DUCTS  Result Date: 05/10/2020 CLINICAL DATA:  Choledocholithiasis. EXAM: ERCP TECHNIQUE: Multiple spot images obtained with the fluoroscopic device and submitted for interpretation post-procedure. COMPARISON:  CT of the abdomen and pelvis on 05/08/2020 FINDINGS: Imaging with a C-arm demonstrates cannulation of the common bile duct with cholangiogram demonstrating suggestion of a filling defect in the common bile duct and no evidence of significant biliary dilatation. Balloon sweep maneuver was performed as well as balloon dilatation across the level of the distal CBD and ampulla. IMPRESSION: Filling defect in the common bile duct consistent with choledocholithiasis. Balloon sweep  maneuver and balloon dilatation performed of the CBD. These images were submitted for radiologic interpretation only. Please see the procedural report for the amount of contrast and the fluoroscopy time utilized. Electronically Signed   By: Irish Lack M.D.   On: 05/10/2020 09:26   US Abdomen Limited RUQ (LIVER/GB)  Result  Date: 05/08/2020 CLINICAL DATA:  Transaminitis EXAM: ULTRASOUND ABDOMEN LIMITED RIGHT UPPER QUADRANT COMPARISON:  None. FINDINGS: Gallbladder: Gallbladder is moderately distended, with dependent gallbladder sludge identified. No shadowing gallstones. Borderline gallbladder wall thickening measuring 3 mm. No pericholecystic fluid. Negative sonographic Murphy sign. Common bile duct: Diameter: 7 mm Liver: Diffuse increased liver echotexture most consistent with hepatic steatosis. No focal liver abnormality. Portal vein is patent on color Doppler imaging with normal direction of blood flow towards the liver. Other: Incidental 1.8 cm simple right renal cyst. IMPRESSION: 1. Gallbladder sludge, with nonspecific borderline gallbladder wall thickening. No evidence of cholelithiasis or cholecystitis. 2. Hepatic steatosis. Electronically Signed   By: Sharlet Salina M.D.   On: 05/08/2020 21:49       Subjective:  No new complaints.  Discharge Exam: Vitals:   05/13/20 2002 05/14/20 0355  BP: 134/61 (!) 150/69  Pulse: 66 75  Resp: 18 16  Temp: 98.8 F (37.1 C) 98.6 F (37 C)  SpO2: 96% 94%   Vitals:   05/13/20 0638 05/13/20 1300 05/13/20 2002 05/14/20 0355  BP: 130/76 137/79 134/61 (!) 150/69  Pulse: 73 70 66 75  Resp: 20 18 18 16   Temp: (!) 97.5 F (36.4 C) 97.8 F (36.6 C) 98.8 F (37.1 C) 98.6 F (37 C)  TempSrc: Oral Oral Oral   SpO2: 95% 97% 96% 94%  Weight:      Height:        General: Pt is alert, awake, not in acute distress Cardiovascular: RRR, S1/S2 +, no rubs, no gallops Respiratory: CTA bilaterally, no wheezing, no rhonchi Abdominal: Soft, NT, ND, bowel sounds + Extremities: no edema, no cyanosis    The results of significant diagnostics from this hospitalization (including imaging, microbiology, ancillary and laboratory) are listed below for reference.     Microbiology: Recent Results (from the past 240 hour(s))  Resp Panel by RT-PCR (Flu A&B, Covid) Nasopharyngeal Swab      Status: None   Collection Time: 05/08/20 11:45 PM   Specimen: Nasopharyngeal Swab; Nasopharyngeal(NP) swabs in vial transport medium  Result Value Ref Range Status   SARS Coronavirus 2 by RT PCR NEGATIVE NEGATIVE Final    Comment: (NOTE) SARS-CoV-2 target nucleic acids are NOT DETECTED.  The SARS-CoV-2 RNA is generally detectable in upper respiratory specimens during the acute phase of infection. The lowest concentration of SARS-CoV-2 viral copies this assay can detect is 138 copies/mL. A negative result does not preclude SARS-Cov-2 infection and should not be used as the sole basis for treatment or other patient management decisions. A negative result may occur with  improper specimen collection/handling, submission of specimen other than nasopharyngeal swab, presence of viral mutation(s) within the areas targeted by this assay, and inadequate number of viral copies(<138 copies/mL). A negative result must be combined with clinical observations, patient history, and epidemiological information. The expected result is Negative.  Fact Sheet for Patients:  07/08/20  Fact Sheet for Healthcare Providers:  BloggerCourse.com  This test is no t yet approved or cleared by the [place] FDA and  has been authorized for detection and/or diagnosis of SARS-CoV-2 by FDA under an Emergency Use Authorization (EUA). This EUA will remain  in effect (meaning this test can be used) for the duration of the COVID-19 declaration under Section 564(b)(1) of the Act, 21 U.S.C.section 360bbb-3(b)(1), unless the authorization is terminated  or revoked sooner.       Influenza A by PCR NEGATIVE NEGATIVE Final   Influenza B by PCR NEGATIVE NEGATIVE Final    Comment: (NOTE) The Xpert Xpress SARS-CoV-2/FLU/RSV plus assay is intended as an aid in the diagnosis of influenza from Nasopharyngeal swab specimens and should not be used as a sole basis  for treatment. Nasal washings and aspirates are unacceptable for Xpert Xpress SARS-CoV-2/FLU/RSV testing.  Fact Sheet for Patients: BloggerCourse.com  Fact Sheet for Healthcare Providers: SeriousBroker.it  This test is not yet approved or cleared by the [place] FDA and has been authorized for detection and/or diagnosis of SARS-CoV-2 by FDA under an Emergency Use Authorization (EUA). This EUA will remain in effect (meaning this test can be used) for the duration of the COVID-19 declaration under Section 564(b)(1) of the Act, 21 U.S.C. section 360bbb-3(b)(1), unless the authorization is terminated or revoked.  Performed at [facility], [address] [place], [place] [address]   MRSA PCR Screening     Status: None   Collection Time: 05/09/20  2:35 AM   Specimen: Nasopharyngeal  Result Value Ref Range Status   MRSA by PCR NEGATIVE NEGATIVE Final    Comment:        The GeneXpert MRSA Assay (FDA approved for NASAL specimens only), is one component of a comprehensive MRSA colonization surveillance program. It is not intended to diagnose MRSA infection nor to guide or monitor treatment for MRSA infections. Performed at [facility], [address] [place], [place] [address]      Labs: BNP (last 3 results) No results for input(s): BNP in the last 8760 hours. Basic Metabolic Panel: Recent Labs  Lab 05/10/20 0509 05/11/20 0340 05/12/20 0737 05/13/20 0400 05/14/20 0359  NA 136 138 136 135 137  K 3.3* 3.9 3.8 4.4 3.7  CL 98 100 103 103 107  CO2 27 27 21* 17* 23  GLUCOSE 177* 125* 135* 199* 176*  BUN 25* 21 14 25* 31*  CREATININE 1.41* 1.21 0.90 1.24 1.10  CALCIUM 8.9 9.2 8.5* 8.3* 8.2*   Liver Function Tests: Recent Labs  Lab 05/10/20 0509 05/11/20 0340 05/12/20 0737 05/13/20 0400 05/14/20 0359  AST 196* 131* 99* 56* 61*  ALT 216* 189* 147* 111* 94*  ALKPHOS 301* 307* 407*  440* 447*  BILITOT 5.1* 5.0* 4.3* 3.3* 2.2*  PROT 5.5* 6.0* 5.5* 5.5* 5.1*  ALBUMIN 2.9* 2.9* 2.6* 2.5* 2.4*   Recent Labs  Lab 05/11/20 0340 05/12/20 0737  LIPASE 357* 34   No results for input(s): AMMONIA in the last 168 hours. CBC: Recent Labs  Lab 05/10/20 0509 05/11/20 0340 05/12/20 0737 05/14/20 0829  WBC 6.0 6.0 4.7 6.1  HGB 10.8* 12.0* 10.7* 11.2*  HCT 32.4* 37.1* 33.3* 36.2*  MCV 89.5 91.6 91.2 92.8  PLT 129* 149* 135* 162   Cardiac Enzymes: No results for input(s): CKTOTAL, CKMB, CKMBINDEX, TROPONINI in the last 168 hours. BNP: Invalid input(s): POCBNP CBG: Recent Labs  Lab 05/13/20 1309 05/13/20 1736 05/13/20 2003 05/14/20 0746 05/14/20 1143  GLUCAP 243* 311* 264* 161* 212*   D-Dimer No results for input(s): DDIMER in the last 72 hours. Hgb A1c No results for input(s): HGBA1C in the last 72 hours. Lipid Profile No results for input(s): CHOL, HDL, LDLCALC, TRIG, CHOLHDL,  LDLDIRECT in the last 72 hours. Thyroid function studies No results for input(s): TSH, T4TOTAL, T3FREE, THYROIDAB in the last 72 hours.  Invalid input(s): FREET3 Anemia work up No results for input(s): VITAMINB12, FOLATE, FERRITIN, TIBC, IRON, RETICCTPCT in the last 72 hours. Urinalysis    Component Value Date/Time   COLORURINE YELLOW 05/08/2020 2345   APPEARANCEUR CLEAR 05/08/2020 2345   LABSPEC 1.015 05/08/2020 2345   PHURINE 5.0 05/08/2020 2345   GLUCOSEU >=500 (A) 05/08/2020 2345   HGBUR SMALL (A) 05/08/2020 2345   BILIRUBINUR NEGATIVE 05/08/2020 2345   KETONESUR 80 (A) 05/08/2020 2345   PROTEINUR NEGATIVE 05/08/2020 2345   NITRITE NEGATIVE 05/08/2020 2345   LEUKOCYTESUR NEGATIVE 05/08/2020 2345   Sepsis Labs Invalid input(s): PROCALCITONIN,  WBC,  LACTICIDVEN Microbiology Recent Results (from the past 240 hour(s))  Resp Panel by RT-PCR (Flu A&B, Covid) Nasopharyngeal Swab     Status: None   Collection Time: 05/08/20 11:45 PM   Specimen: Nasopharyngeal Swab;  Nasopharyngeal(NP) swabs in vial transport medium  Result Value Ref Range Status   SARS Coronavirus 2 by RT PCR NEGATIVE NEGATIVE Final    Comment: (NOTE) SARS-CoV-2 target nucleic acids are NOT DETECTED.  The SARS-CoV-2 RNA is generally detectable in upper respiratory specimens during the acute phase of infection. The lowest concentration of SARS-CoV-2 viral copies this assay can detect is 138 copies/mL. A negative result does not preclude SARS-Cov-2 infection and should not be used as the sole basis for treatment or other patient management decisions. A negative result may occur with  improper specimen collection/handling, submission of specimen other than nasopharyngeal swab, presence of viral mutation(s) within the areas targeted by this assay, and inadequate number of viral copies(<138 copies/mL). A negative result must be combined with clinical observations, patient history, and epidemiological information. The expected result is Negative.  Fact Sheet for Patients:  BloggerCourse.com  Fact Sheet for Healthcare Providers:  [employer]ious[employer]ker.it  This test is no t yet approved or cleared by the [place] FDA and  has been authorized for detection and/or diagnosis of SARS-CoV-2 by FDA under an Emergency Use Authorization (EUA). This EUA will remain  in effect (meaning this test can be used) for the duration of the COVID-19 declaration under Section 564(b)(1) of the Act, 21 U.S.C.section 360bbb-3(b)(1), unless the authorization is terminated  or revoked sooner.       Influenza A by PCR NEGATIVE NEGATIVE Final   Influenza B by PCR NEGATIVE NEGATIVE Final    Comment: (NOTE) The Xpert Xpress SARS-CoV-2/FLU/RSV plus assay is intended as an aid in the diagnosis of influenza from Nasopharyngeal swab specimens and should not be used as a sole basis for treatment. Nasal washings and aspirates are unacceptable for Xpert Xpress  SARS-CoV-2/FLU/RSV testing.  Fact Sheet for Patients: BloggerCourse.com  Fact Sheet for Healthcare Providers: SeriousBroker.it  This test is not yet approved or cleared by the [place] FDA and has been authorized for detection and/or diagnosis of SARS-CoV-2 by FDA under an Emergency Use Authorization (EUA). This EUA will remain in effect (meaning this test can be used) for the duration of the COVID-19 declaration under Section 564(b)(1) of the Act, 21 U.S.C. section 360bbb-3(b)(1), unless the authorization is terminated or revoked.  Performed at [facility], [address] [place], [place] [address]   MRSA PCR Screening     Status: None   Collection Time: 05/09/20  2:35 AM   Specimen: Nasopharyngeal  Result Value Ref Range Status   MRSA by PCR NEGATIVE  NEGATIVE Final    Comment:        The GeneXpert MRSA Assay (FDA approved for NASAL specimens only), is one component of a comprehensive MRSA colonization surveillance program. It is not intended to diagnose MRSA infection nor to guide or monitor treatment for MRSA infections. Performed at [facility], [address], [place], [place] [address]      Time coordinating discharge: 32  Minutes.   SIGNED:   Kathlen Mody, MD  [facility]

## 2021-12-02 IMAGING — US US ABDOMEN LIMITED RUQ/ASCITES
1 series · 14 of 25 positions shown · non-contrast
Comparison: None.

CLINICAL DATA: Transaminitis

EXAM:
ULTRASOUND ABDOMEN LIMITED RIGHT UPPER QUADRANT

[Series 1: us abdomen limited ruq/ascites · 14 of 65 slices shown]
[im 1/65]
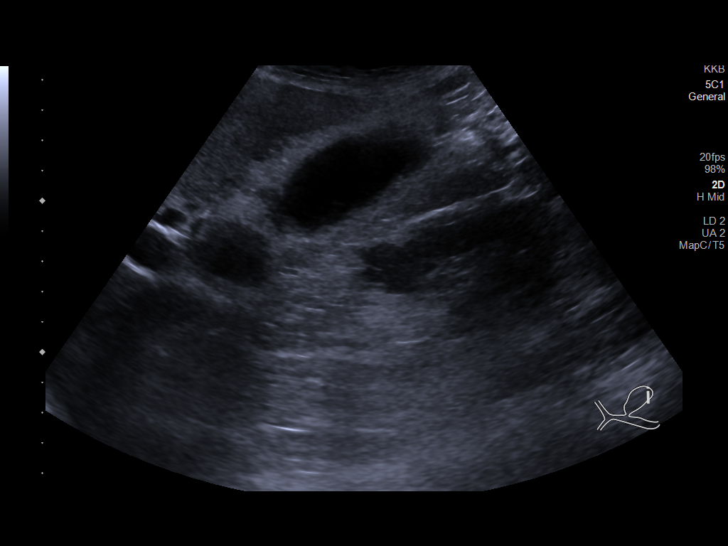
[im 6/65]
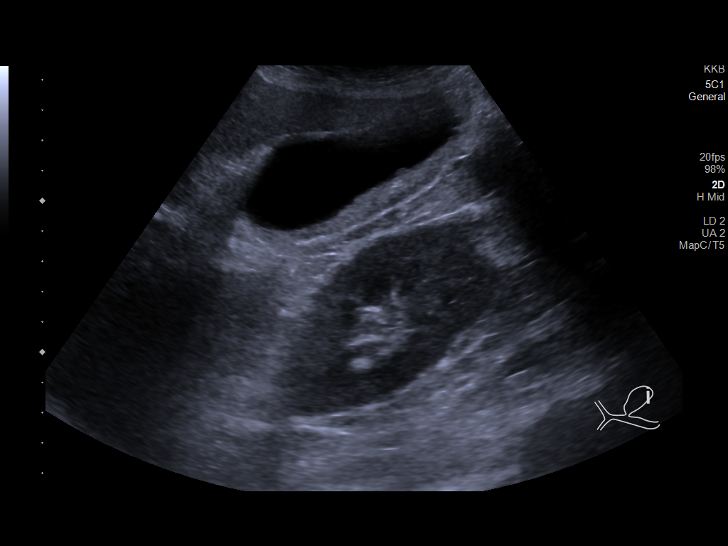
[im 11/65]
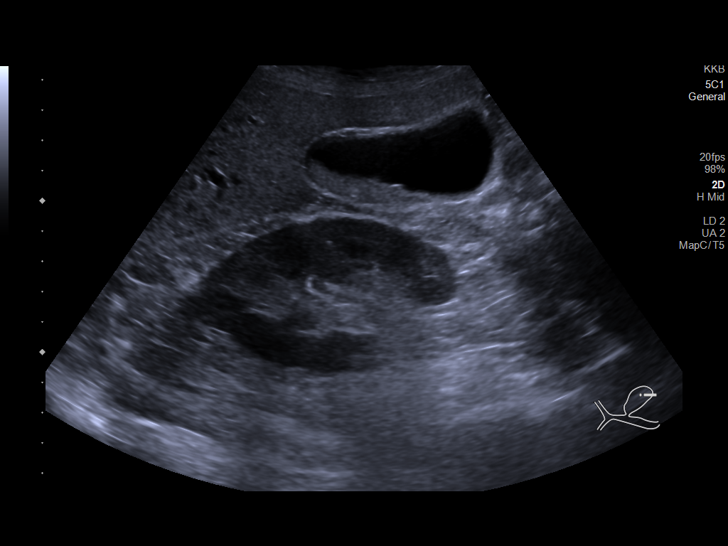
[im 17/65]
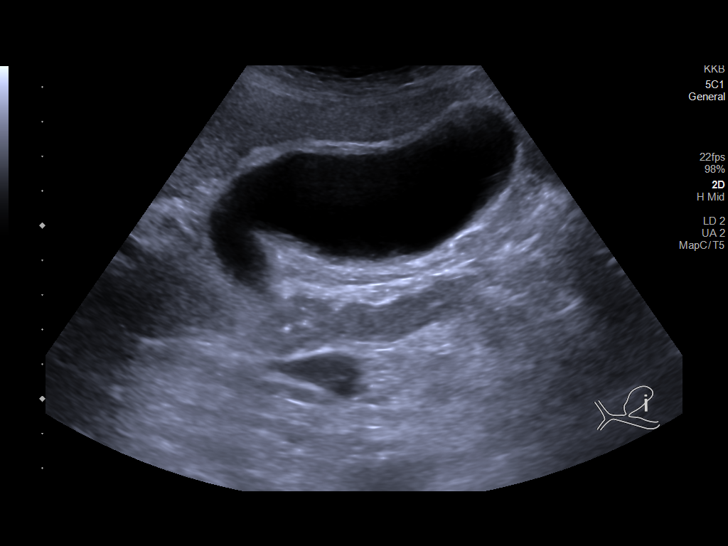
[im 22/65]
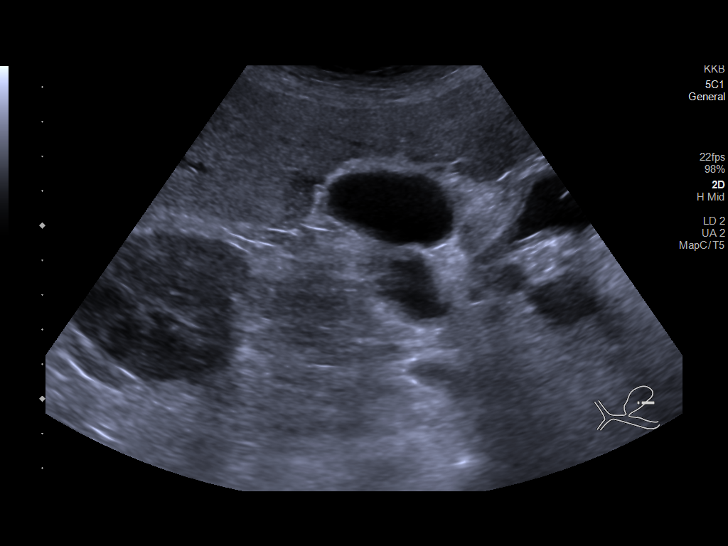
[im 25/65]
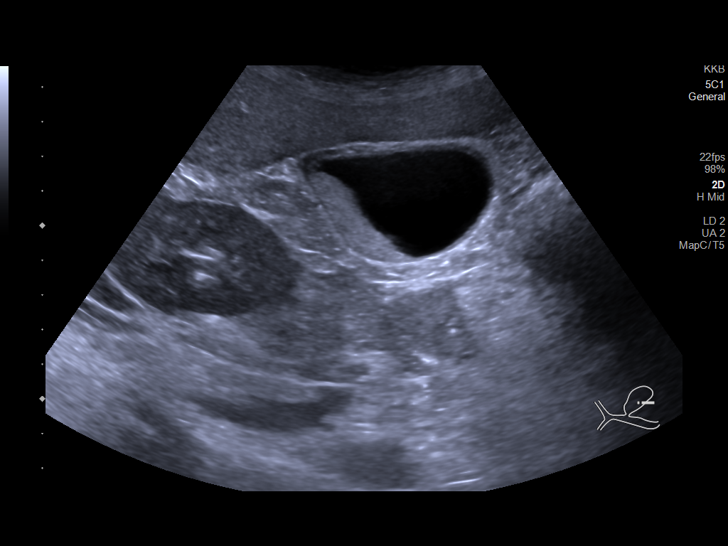
[im 30/65]
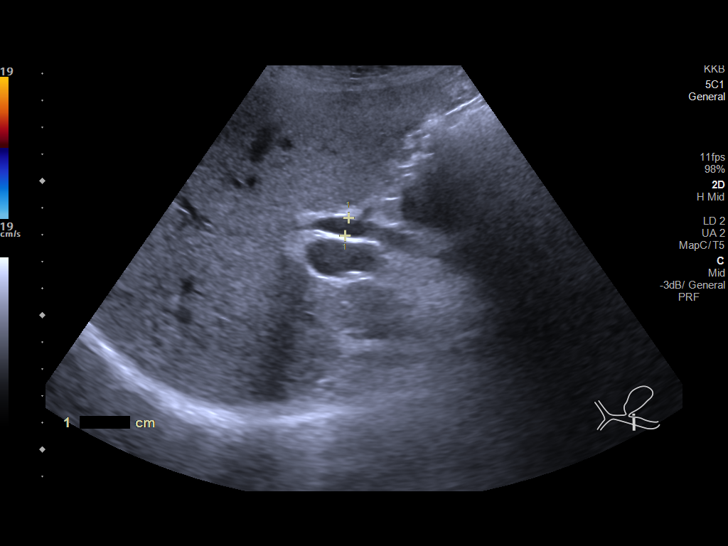
[im 35/65]
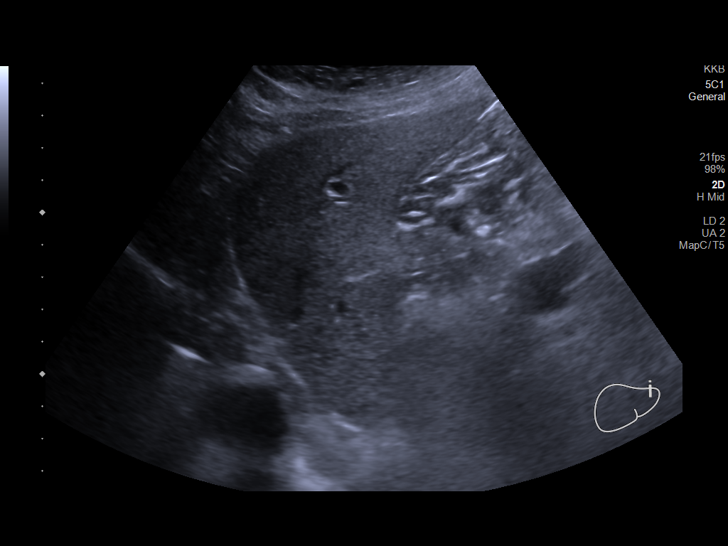
[im 41/65]
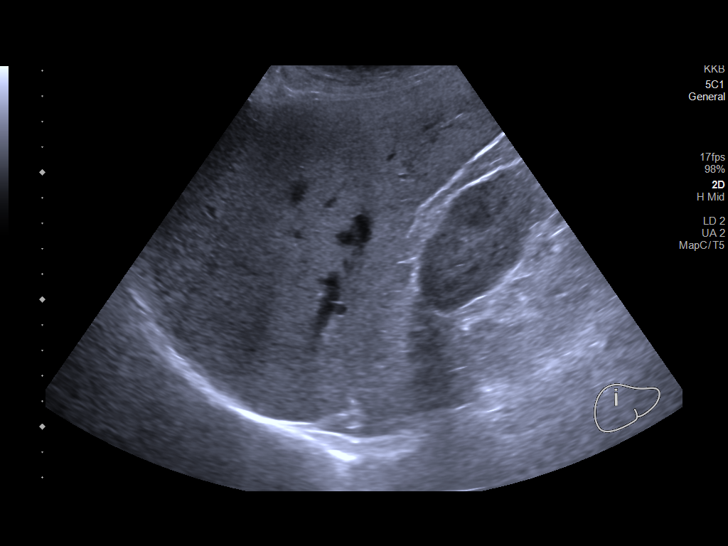
[im 43/65]
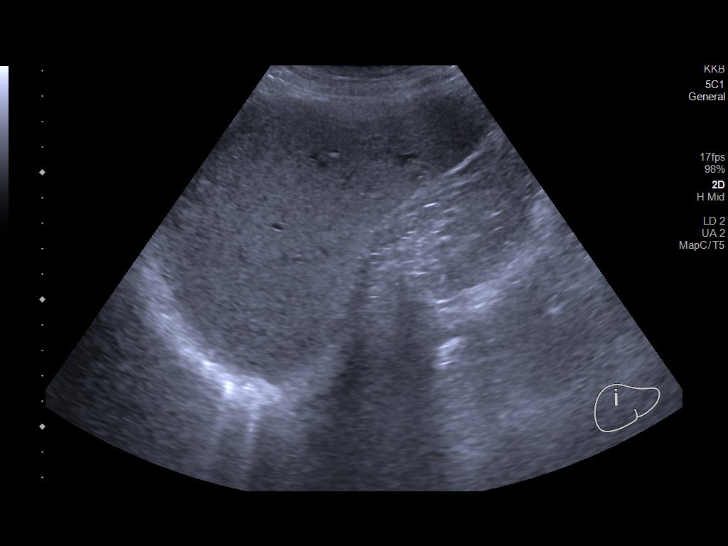
[im 49/65]
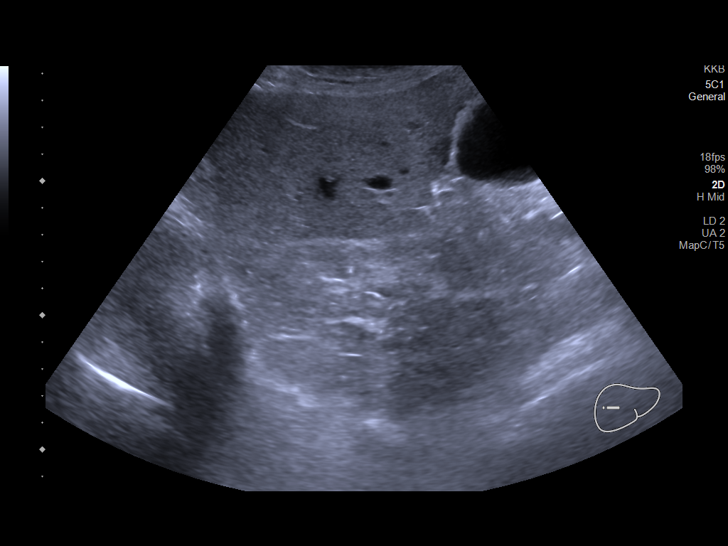
[im 54/65]
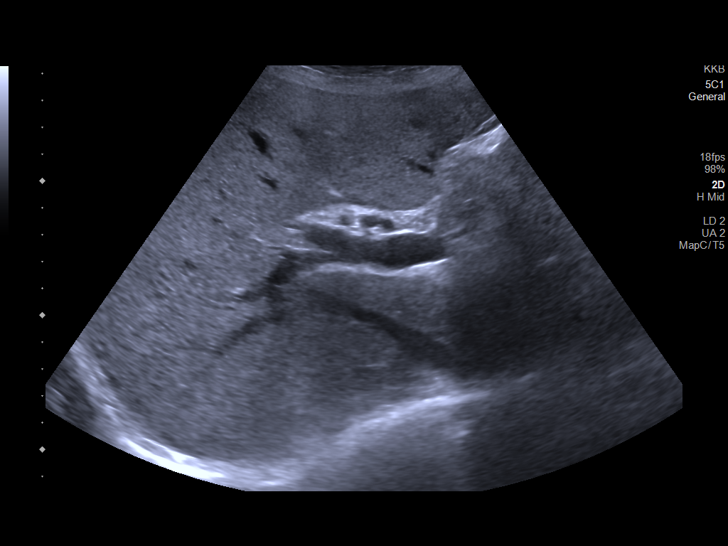
[im 59/65]
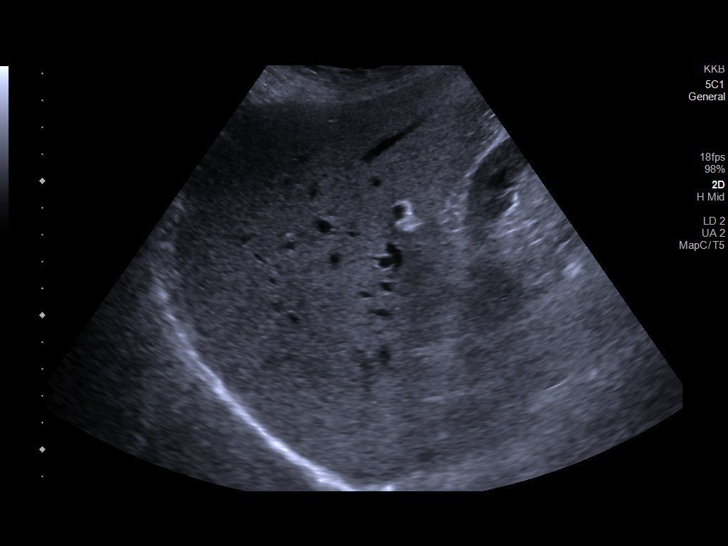
[im 65/65]
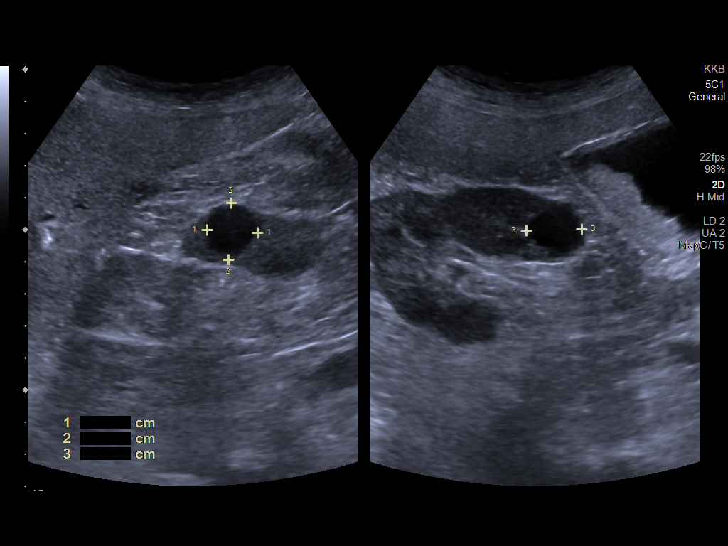

[14 of 25 positions shown; findings below may reference images not displayed]

FINDINGS: Gallbladder:

Gallbladder is moderately distended, with dependent gallbladder
sludge identified. No shadowing gallstones. Borderline gallbladder
wall thickening measuring 3 mm. No pericholecystic fluid. Negative
sonographic Murphy sign.

Common bile duct:

Diameter: 7 mm

Liver:

Diffuse increased liver echotexture most consistent with hepatic
steatosis. No focal liver abnormality. Portal vein is patent on
color Doppler imaging with normal direction of blood flow towards
the liver.

Other: Incidental 1.8 cm simple right renal cyst.
IMPRESSION: 1. Gallbladder sludge, with nonspecific borderline gallbladder wall
thickening. No evidence of cholelithiasis or cholecystitis.
2. Hepatic steatosis.

## 2021-12-04 IMAGING — RF DG ERCP WO/W SPHINCTEROTOMY
1 series · 15 of 16 positions shown · non-contrast
Comparison: CT of the abdomen and pelvis on 05/08/2020

CLINICAL DATA: Choledocholithiasis.

EXAM:
ERCP
TECHNIQUE: Multiple spot images obtained with the fluoroscopic device and
submitted for interpretation post-procedure.

[Series 1: run · 15 of 16 slices shown]
[im 1/16]
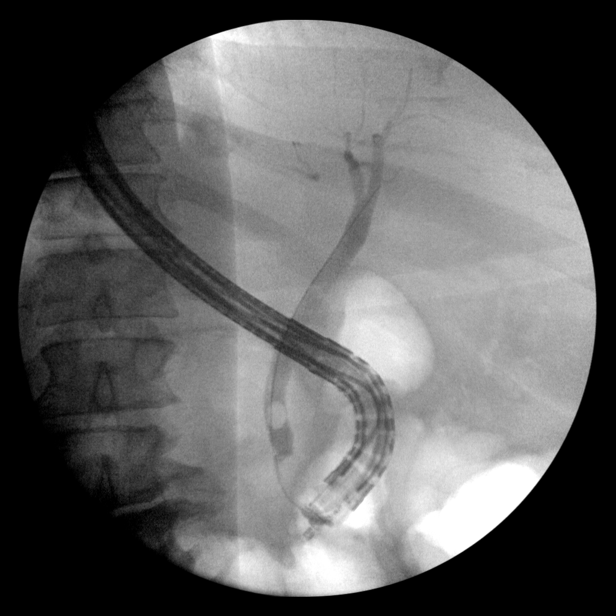
[im 2/16]
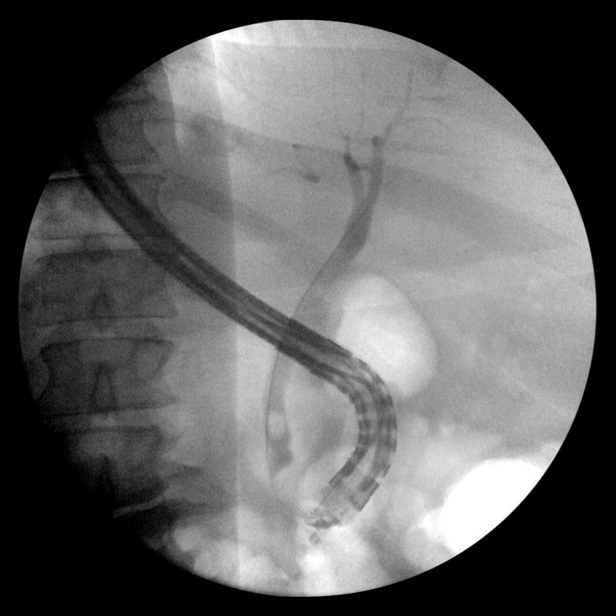
[im 3/16]
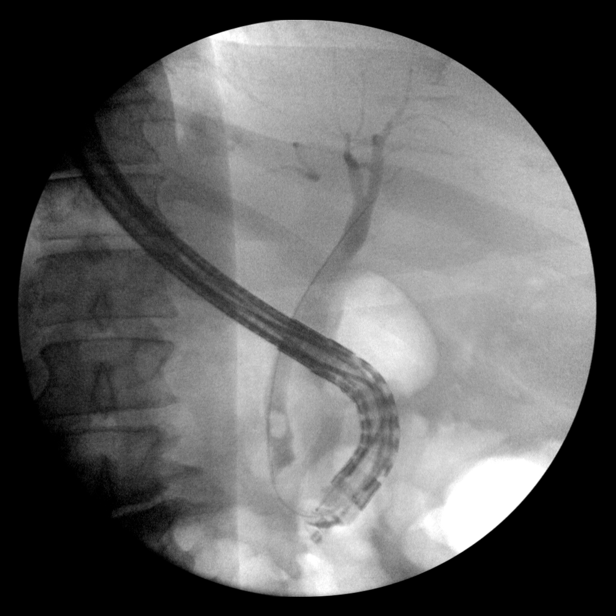
[im 4/16]
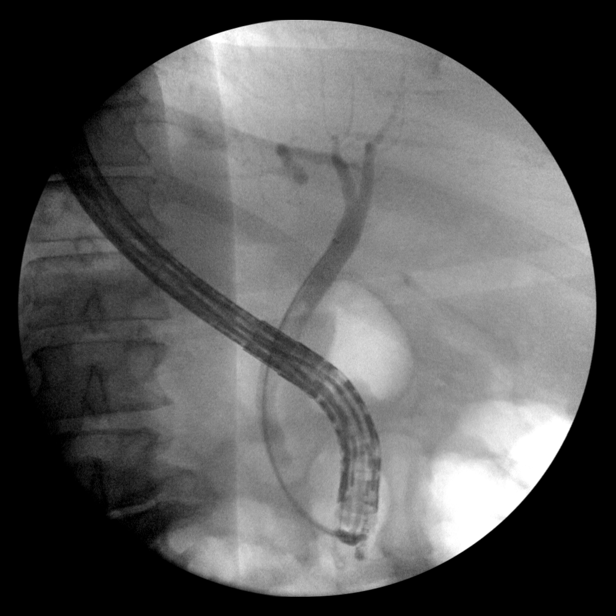
[im 5/16]
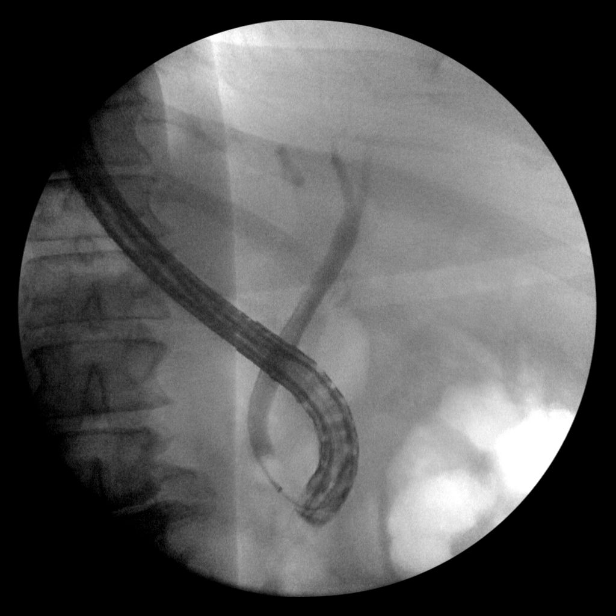
[im 6/16]
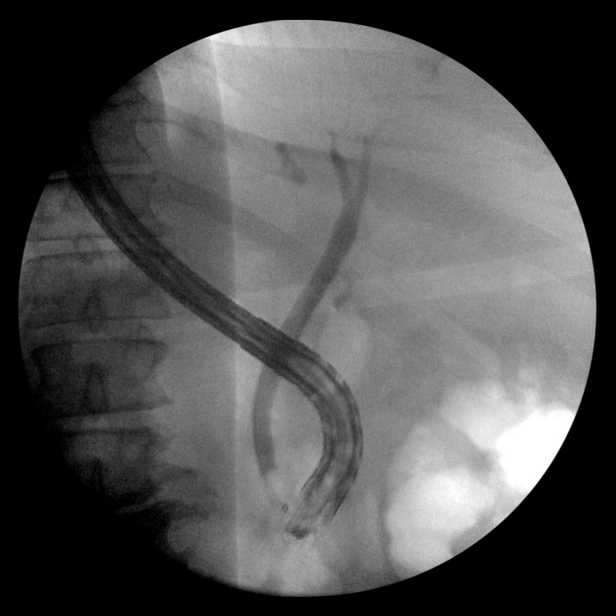
[im 7/16]
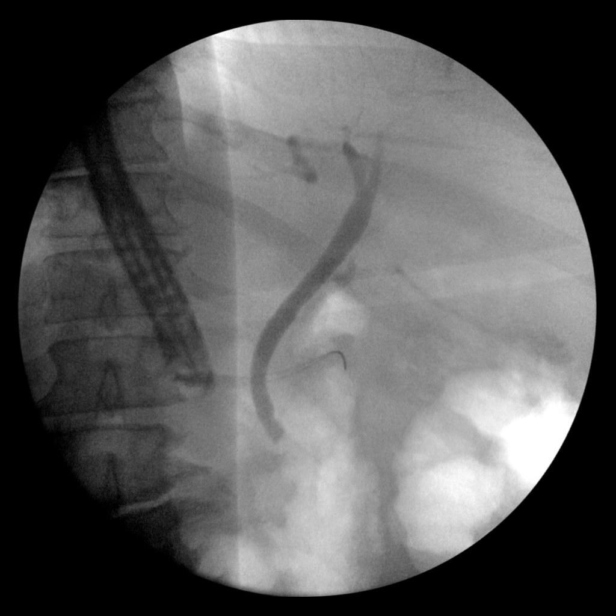
[im 9/16]
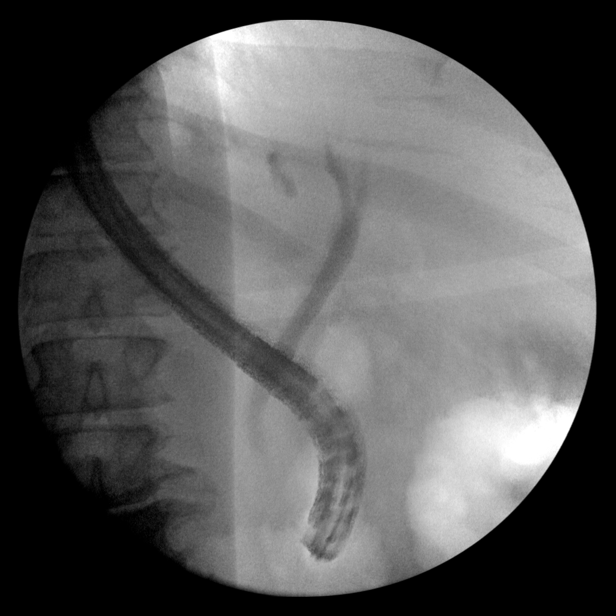
[im 10/16]
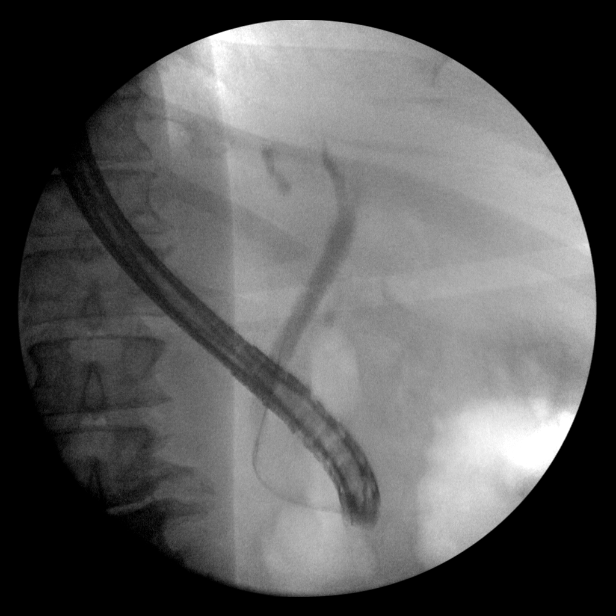
[im 11/16]
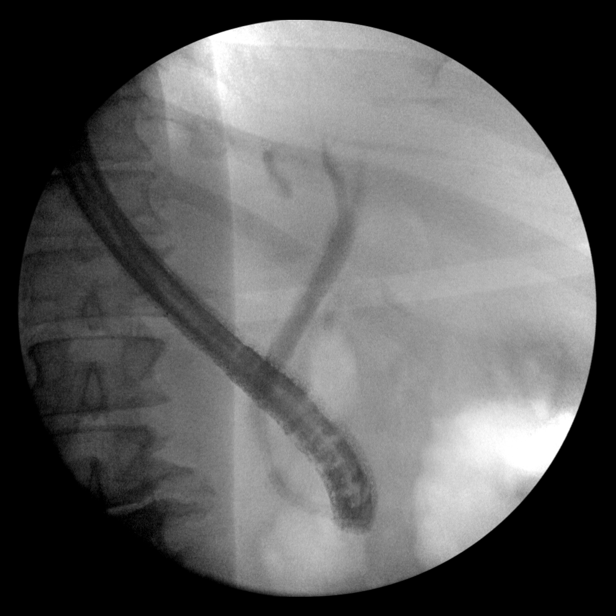
[im 12/16]
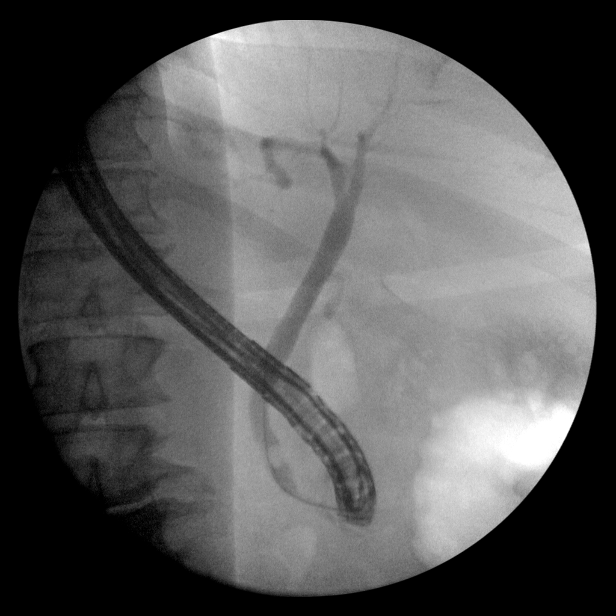
[im 13/16]
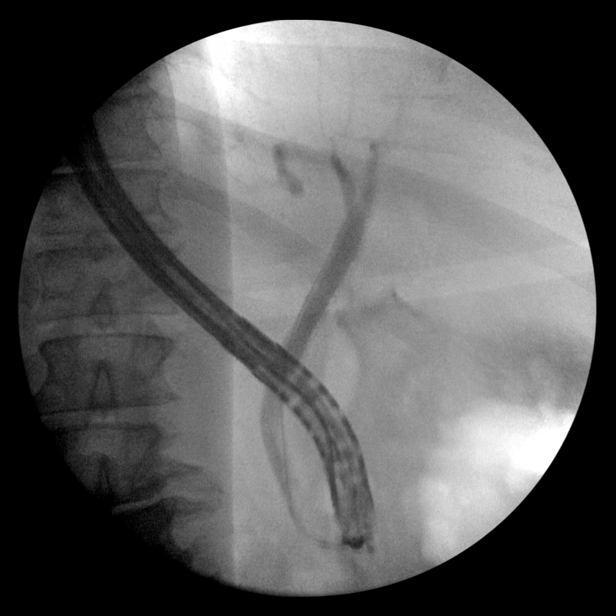
[im 14/16]
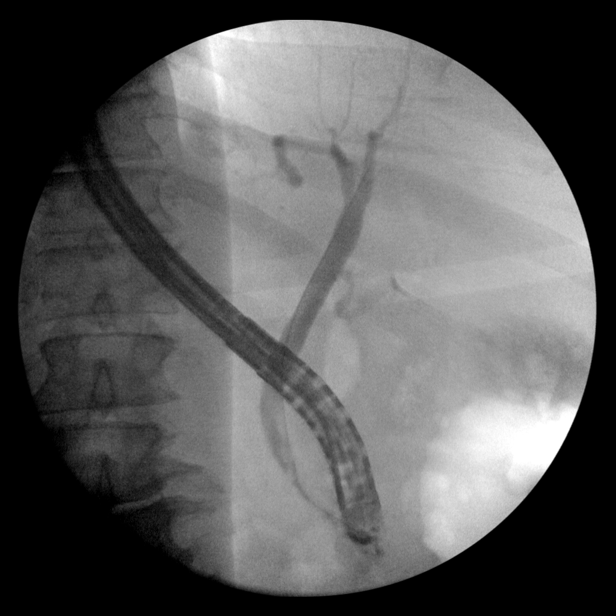
[im 15/16]
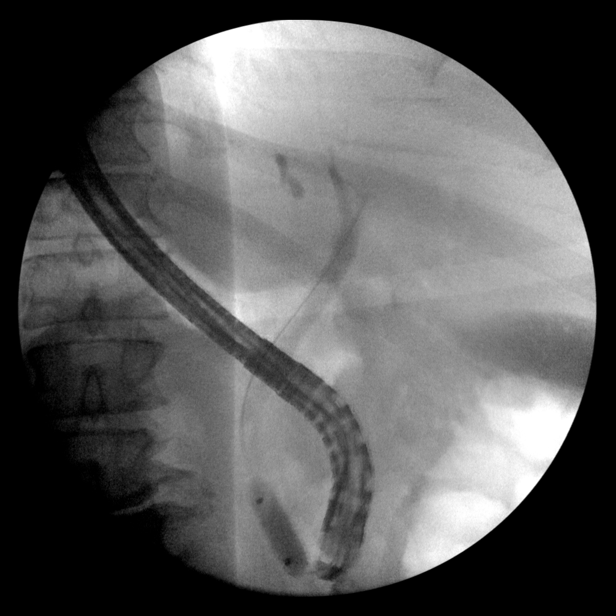
[im 16/16]
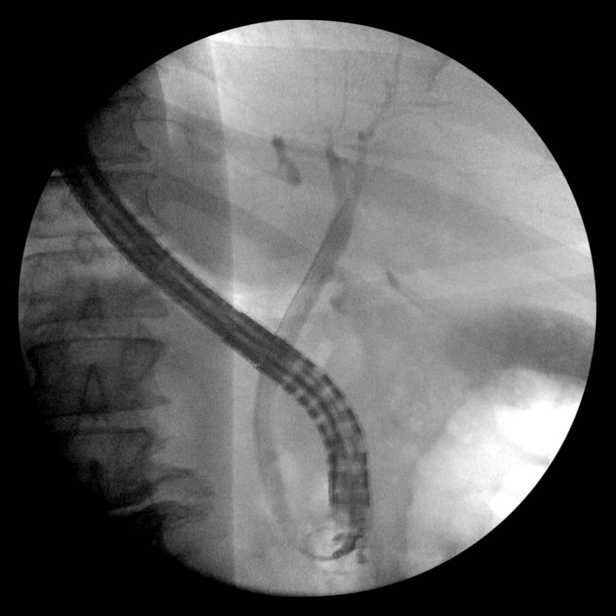

[15 of 16 positions shown; findings below may reference images not displayed]

FINDINGS: Imaging with a C-arm demonstrates cannulation of the common bile
duct with cholangiogram demonstrating suggestion of a filling defect
in the common bile duct and no evidence of significant biliary
dilatation. Balloon sweep maneuver was performed as well as balloon
dilatation across the level of the distal CBD and ampulla.
IMPRESSION: Filling defect in the common bile duct consistent with
choledocholithiasis. Balloon sweep maneuver and balloon dilatation
performed of the CBD.

These images were submitted for radiologic interpretation only.
Please see the procedural report for the amount of contrast and the
fluoroscopy time utilized.

## 2022-03-09 DEATH — deceased
